# Patient Record
Sex: Male | Born: 1985 | Marital: Married | Smoking: Never smoker
Health system: Southern US, Community
[De-identification: ages and names within clinical notes are randomized; demographics above are authoritative.]

## PROBLEM LIST (undated history)

## (undated) DIAGNOSIS — T7840XA Allergy, unspecified, initial encounter: Secondary | ICD-10-CM

## (undated) DIAGNOSIS — S42009A Fracture of unspecified part of unspecified clavicle, initial encounter for closed fracture: Secondary | ICD-10-CM

## (undated) HISTORY — DX: Fracture of unspecified part of unspecified clavicle, initial encounter for closed fracture: S42.009A

## (undated) HISTORY — DX: Allergy, unspecified, initial encounter: T78.40XA

## (undated) HISTORY — PX: NO PAST SURGERIES: SHX2092

---

## 2006-09-04 ENCOUNTER — Emergency Department: Payer: Self-pay | Admitting: Emergency Medicine

## 2008-09-15 ENCOUNTER — Emergency Department: Payer: Self-pay | Admitting: Emergency Medicine

## 2008-09-24 ENCOUNTER — Emergency Department: Payer: Self-pay | Admitting: Emergency Medicine

## 2009-04-03 ENCOUNTER — Emergency Department: Payer: Self-pay | Admitting: Internal Medicine

## 2010-07-19 ENCOUNTER — Emergency Department: Payer: Self-pay | Admitting: Unknown Physician Specialty

## 2010-07-22 ENCOUNTER — Emergency Department: Payer: Self-pay | Admitting: Internal Medicine

## 2012-04-16 ENCOUNTER — Telehealth: Payer: Self-pay | Admitting: *Deleted

## 2012-04-16 ENCOUNTER — Ambulatory Visit: Payer: Self-pay | Admitting: Internal Medicine

## 2012-04-16 NOTE — Telephone Encounter (Signed)
Noted  

## 2012-04-16 NOTE — Telephone Encounter (Signed)
Patient called back stating he would not be able to keep the r/s appt made for him on 3/12, he need something later in the day. The only thing I saw was on 3/17 at 3:30, which was not 30 minute slot but I put him there. Is that ok?

## 2012-04-16 NOTE — Telephone Encounter (Signed)
That is fine 

## 2012-05-07 ENCOUNTER — Ambulatory Visit: Payer: Self-pay | Admitting: Internal Medicine

## 2012-05-12 ENCOUNTER — Ambulatory Visit (INDEPENDENT_AMBULATORY_CARE_PROVIDER_SITE_OTHER): Payer: PRIVATE HEALTH INSURANCE | Admitting: Internal Medicine

## 2012-05-12 ENCOUNTER — Encounter: Payer: Self-pay | Admitting: Internal Medicine

## 2012-05-12 VITALS — BP 118/74 | HR 70 | Temp 98.2°F | Ht 69.0 in | Wt 190.0 lb

## 2012-05-12 DIAGNOSIS — Z0000 Encounter for general adult medical examination without abnormal findings: Secondary | ICD-10-CM | POA: Insufficient documentation

## 2012-05-12 NOTE — Assessment & Plan Note (Signed)
General medical exam normal today. Health maintenance UTD except for labs. Will check CBC, CMP, lipids, urine microalbumin with labs today. Encouraged pt to limit sodium intake. Encouraged exercise 3x per week. Encouraged diet low in saturated fat and high in fiber. Follow up 1 year and prn.

## 2012-05-12 NOTE — Progress Notes (Signed)
  Subjective:    Patient ID: Greg Bradley, male    DOB: 10-05-1985, 27 y.o.   MRN: [id number]  HPI 26YO male presents to establish care. Notes that BP has been elevated 130-140s SBP on some occasions, especially after eating high-salt foods such as pizza. Denies any chest pain, palpitations, headache. Exercises several times per week at local gym. No new concerns today.  No outpatient encounter prescriptions on file as of 05/12/2012.   No facility-administered encounter medications on file as of 05/12/2012.   BP 118/74  Pulse 70  Temp(Src) 98.2 F (36.8 C) (Oral)  Ht 5\' 9"  (1.753 m)  Wt 190 lb (86.183 kg)  BMI 28.05 kg/m2  SpO2 96%  Review of Systems  Constitutional: Negative for fever, chills, activity change, appetite change, fatigue and unexpected weight change.  Eyes: Negative for visual disturbance.  Respiratory: Negative for cough and shortness of breath.   Cardiovascular: Negative for chest pain, palpitations and leg swelling.  Gastrointestinal: Negative for abdominal pain and abdominal distention.  Genitourinary: Negative for dysuria, urgency and difficulty urinating.  Musculoskeletal: Negative for arthralgias and gait problem.  Skin: Negative for color change and rash.  Hematological: Negative for adenopathy.  Psychiatric/Behavioral: Negative for sleep disturbance and dysphoric mood. The patient is not nervous/anxious.        Objective:   Physical Exam  Constitutional: He is oriented to person, place, and time. He appears well-developed and well-nourished. No distress.  HENT:  Head: Normocephalic and atraumatic.  Right Ear: External ear normal.  Left Ear: External ear normal.  Nose: Nose normal.  Mouth/Throat: Oropharynx is clear and moist. No oropharyngeal exudate.  Eyes: Conjunctivae and EOM are normal. Pupils are equal, round, and reactive to light. Right eye exhibits no discharge. Left eye exhibits no discharge. No scleral icterus.  Neck: Normal range of motion.  Neck supple. No tracheal deviation present. No thyromegaly present.  Cardiovascular: Normal rate, regular rhythm and normal heart sounds.  Exam reveals no gallop and no friction rub.   No murmur heard. Pulmonary/Chest: Effort normal and breath sounds normal. No respiratory distress. He has no wheezes. He has no rales. He exhibits no tenderness.  Abdominal: Soft. Bowel sounds are normal. He exhibits no distension. There is no tenderness. There is no rebound and no guarding.  Musculoskeletal: Normal range of motion. He exhibits no edema.  Lymphadenopathy:    He has no cervical adenopathy.  Neurological: He is alert and oriented to person, place, and time. No cranial nerve deficit. Coordination normal.  Skin: Skin is warm and dry. No rash noted. He is not diaphoretic. No erythema. No pallor.  Psychiatric: He has a normal mood and affect. His behavior is normal. Judgment and thought content normal.          Assessment & Plan:

## 2012-05-13 LAB — CBC WITH DIFFERENTIAL/PLATELET
Basophils Relative: 0.8 % (ref 0.0–3.0)
Eosinophils Relative: 5.9 % — ABNORMAL HIGH (ref 0.0–5.0)
Lymphocytes Relative: 43.8 % (ref 12.0–46.0)
Monocytes Relative: 7.6 % (ref 3.0–12.0)
Neutrophils Relative %: 41.9 % — ABNORMAL LOW (ref 43.0–77.0)
RBC: 4.97 Mil/uL (ref 4.22–5.81)
WBC: 5.6 10*3/uL (ref 4.5–10.5)

## 2012-05-13 LAB — LIPID PANEL
HDL: 63.2 mg/dL (ref >39.00)
LDL Cholesterol: 77 mg/dL (ref 0–99)
Total CHOL/HDL Ratio: 2
Triglycerides: 45 mg/dL (ref 0.0–149.0)

## 2012-05-13 LAB — COMPREHENSIVE METABOLIC PANEL
Alkaline Phosphatase: 60 U/L (ref 39–117)
BUN: 18 mg/dL (ref 6–23)
Glucose, Bld: 92 mg/dL (ref 70–99)
Sodium: 136 mEq/L (ref 135–145)
Total Bilirubin: 0.6 mg/dL (ref 0.3–1.2)
Total Protein: 7.8 g/dL (ref 6.0–8.3)

## 2012-05-13 LAB — MICROALBUMIN / CREATININE URINE RATIO
Creatinine,U: 125.4 mg/dL
Microalb Creat Ratio: 0.1 mg/g (ref 0.0–30.0)
Microalb, Ur: 0.1 mg/dL (ref 0.0–1.9)

## 2012-05-14 ENCOUNTER — Encounter: Payer: Self-pay | Admitting: *Deleted

## 2013-10-09 ENCOUNTER — Ambulatory Visit (INDEPENDENT_AMBULATORY_CARE_PROVIDER_SITE_OTHER): Payer: PRIVATE HEALTH INSURANCE | Admitting: Internal Medicine

## 2013-10-09 ENCOUNTER — Encounter: Payer: Self-pay | Admitting: Internal Medicine

## 2013-10-09 VITALS — BP 110/70 | HR 65 | Temp 97.8°F | Ht 71.0 in | Wt 189.2 lb

## 2013-10-09 DIAGNOSIS — Z0000 Encounter for general adult medical examination without abnormal findings: Secondary | ICD-10-CM

## 2013-10-09 LAB — COMPREHENSIVE METABOLIC PANEL
ALBUMIN: 3.9 g/dL (ref 3.5–5.2)
ALT: 17 U/L (ref 0–53)
AST: 23 U/L (ref 0–37)
Alkaline Phosphatase: 52 U/L (ref 39–117)
BILIRUBIN TOTAL: 0.7 mg/dL (ref 0.2–1.2)
BUN: 11 mg/dL (ref 6–23)
CO2: 31 meq/L (ref 19–32)
Calcium: 9.9 mg/dL (ref 8.4–10.5)
Chloride: 101 mEq/L (ref 96–112)
Creatinine, Ser: 1.1 mg/dL (ref 0.4–1.5)
GFR: 84.99 mL/min (ref >60.00)
GLUCOSE: 94 mg/dL (ref 70–99)
POTASSIUM: 4.3 meq/L (ref 3.5–5.1)
SODIUM: 135 meq/L (ref 135–145)
TOTAL PROTEIN: 6.8 g/dL (ref 6.0–8.3)

## 2013-10-09 LAB — TSH: TSH: 0.64 u[IU]/mL (ref 0.35–4.50)

## 2013-10-09 LAB — CBC WITH DIFFERENTIAL/PLATELET
BASOS PCT: 0.9 % (ref 0.0–3.0)
Basophils Absolute: 0 10*3/uL (ref 0.0–0.1)
EOS PCT: 7 % — AB (ref 0.0–5.0)
Eosinophils Absolute: 0.3 10*3/uL (ref 0.0–0.7)
HEMATOCRIT: 44.6 % (ref 39.0–52.0)
HEMOGLOBIN: 14.8 g/dL (ref 13.0–17.0)
LYMPHS ABS: 1.8 10*3/uL (ref 0.7–4.0)
Lymphocytes Relative: 42.6 % (ref 12.0–46.0)
MCHC: 33.1 g/dL (ref 30.0–36.0)
MCV: 88.6 fl (ref 78.0–100.0)
MONOS PCT: 7 % (ref 3.0–12.0)
Monocytes Absolute: 0.3 10*3/uL (ref 0.1–1.0)
Neutro Abs: 1.8 10*3/uL (ref 1.4–7.7)
Neutrophils Relative %: 42.5 % — ABNORMAL LOW (ref 43.0–77.0)
PLATELETS: 150 10*3/uL (ref 150.0–400.0)
RBC: 5.04 Mil/uL (ref 4.22–5.81)
RDW: 13.2 % (ref 11.5–15.5)
WBC: 4.3 10*3/uL (ref 4.0–10.5)

## 2013-10-09 LAB — LIPID PANEL
CHOLESTEROL: 147 mg/dL (ref 0–200)
HDL: 66.3 mg/dL (ref >39.00)
LDL Cholesterol: 72 mg/dL (ref 0–99)
NonHDL: 80.7
Total CHOL/HDL Ratio: 2
Triglycerides: 43 mg/dL (ref 0.0–149.0)
VLDL: 8.6 mg/dL (ref 0.0–40.0)

## 2013-10-09 NOTE — Patient Instructions (Signed)

## 2013-10-09 NOTE — Progress Notes (Signed)
   Subjective:    Patient ID: Greg Bradley, male    DOB: 08/01/1985, 28 y.o.   MRN: [id number]  HPI 28YO male presents for annual exam.  Playing basketball for exercise. Follows regular diet.  Good energy levels. Good appetite. No changes in bowel habits.  No concerns today.  Review of Systems  Constitutional: Negative for fever, chills, activity change, appetite change, fatigue and unexpected weight change.  Eyes: Negative for visual disturbance.  Respiratory: Negative for cough and shortness of breath.   Cardiovascular: Negative for chest pain, palpitations and leg swelling.  Gastrointestinal: Negative for nausea, vomiting, abdominal pain, diarrhea, constipation, blood in stool and abdominal distention.  Genitourinary: Negative for dysuria, urgency and difficulty urinating.  Musculoskeletal: Negative for arthralgias, gait problem and myalgias.  Skin: Negative for color change and rash.  Neurological: Negative for weakness.  Hematological: Negative for adenopathy.  Psychiatric/Behavioral: Negative for sleep disturbance and dysphoric mood. The patient is not nervous/anxious.        Objective:    BP 110/70  Pulse 65  Temp(Src) 97.8 F (36.6 C) (Oral)  Ht 5\' 11"  (1.803 m)  Wt 189 lb 4 oz (85.843 kg)  BMI 26.41 kg/m2  SpO2 95% Physical Exam  Constitutional: He is oriented to person, place, and time. He appears well-developed and well-nourished. No distress.  HENT:  Head: Normocephalic and atraumatic.  Right Ear: External ear normal.  Left Ear: External ear normal.  Nose: Nose normal.  Mouth/Throat: Oropharynx is clear and moist. No oropharyngeal exudate.  Eyes: Conjunctivae and EOM are normal. Pupils are equal, round, and reactive to light. Right eye exhibits no discharge. Left eye exhibits no discharge. No scleral icterus.  Neck: Normal range of motion. Neck supple. No tracheal deviation present. No thyromegaly present.  Cardiovascular: Normal rate, regular rhythm and normal  heart sounds.  Exam reveals no gallop and no friction rub.   No murmur heard. Pulmonary/Chest: Effort normal and breath sounds normal. No respiratory distress. He has no wheezes. He has no rales. He exhibits no tenderness.  Abdominal: Soft. Bowel sounds are normal. He exhibits no distension and no mass. There is no tenderness. There is no rebound and no guarding.  Musculoskeletal: Normal range of motion. He exhibits no edema.  Lymphadenopathy:    He has no cervical adenopathy.  Neurological: He is alert and oriented to person, place, and time. No cranial nerve deficit. Coordination normal.  Skin: Skin is warm and dry. No rash noted. He is not diaphoretic. No erythema. No pallor.  Psychiatric: He has a normal mood and affect. His behavior is normal. Judgment and thought content normal.          Assessment & Plan:   Problem List Items Addressed This Visit     Unprioritized   Routine general medical examination at a health care facility - Primary     General medical exam normal today. Encouraged healthy, Mediterranean style diet and regular exercise with goal of 40min 3x per week. Immunizations are UTD except for flu vaccine which he will get when available. Will check labs including CBC, CMP, lipids, TSH.     Relevant Orders      TSH      CBC with Differential      Comprehensive metabolic panel      Lipid panel       Return in about 1 year (around 10/10/2014) for Physical.

## 2013-10-09 NOTE — Assessment & Plan Note (Signed)
General medical exam normal today. Encouraged healthy, Mediterranean style diet and regular exercise with goal of 3x per week. Immunizations are UTD except for flu vaccine which he will get when available. Will check labs including CBC, CMP, lipids, TSH.

## 2013-10-10 ENCOUNTER — Encounter: Payer: Self-pay | Admitting: Internal Medicine

## 2013-11-21 ENCOUNTER — Ambulatory Visit: Payer: PRIVATE HEALTH INSURANCE

## 2014-09-10 ENCOUNTER — Encounter: Payer: Self-pay | Admitting: Internal Medicine

## 2014-09-10 ENCOUNTER — Ambulatory Visit (INDEPENDENT_AMBULATORY_CARE_PROVIDER_SITE_OTHER): Payer: Managed Care, Other (non HMO) | Admitting: Internal Medicine

## 2014-09-10 VITALS — BP 110/68 | HR 70 | Temp 98.5°F | Resp 14 | Ht 70.0 in | Wt 192.2 lb

## 2014-09-10 DIAGNOSIS — M25561 Pain in right knee: Secondary | ICD-10-CM | POA: Diagnosis not present

## 2014-09-10 DIAGNOSIS — R229 Localized swelling, mass and lump, unspecified: Secondary | ICD-10-CM

## 2014-09-10 NOTE — Assessment & Plan Note (Signed)
Small nodule of right elbow. Given history of recent infection at this site, most likely sebaceous cyst. Other consideration would be rheumatoid nodule. Will monitor for next few weeks. If no resolution, will set up dermatology evaluation for removal.

## 2014-09-10 NOTE — Progress Notes (Signed)
Pre-visit discussion using our clinic review tool. No additional management support is needed unless otherwise documented below in the visit note.  

## 2014-09-10 NOTE — Patient Instructions (Signed)
Email if any concerns or if you would like to set up a dermatology evaluation.  Consider using Gentamicin if any recurrence.

## 2014-09-10 NOTE — Assessment & Plan Note (Signed)
Right inferior knee pain. Suspect infrapatellar bursitis. Now improving. Exam normal. Discussed icing and using support brace. Plan for follow up prn.

## 2014-09-10 NOTE — Progress Notes (Signed)
Subjective:    Patient ID: Greg Bradley, male    DOB: 05/05/1985, 29 y.o.   MRN: [id number]  HPI  28YO male presents for acute visit.  Elbow pain - First noticed about 1.5 months ago. Initially swelled to size of a quarter then improved. Continues to have fluid filled sack underneath. No fever, chills.No history of MRSA.  Right knee pain - Noticed pain below his knee cap after playing basketball a few weeks ago. No swelling. No weakness. No instability. Improved with rest. No current pain.  Past medical, surgical, family and social history per today's encounter.  Review of Systems  Constitutional: Negative for fever, chills, activity change, appetite change, fatigue and unexpected weight change.  Eyes: Negative for visual disturbance.  Respiratory: Negative for cough and shortness of breath.   Cardiovascular: Negative for chest pain, palpitations and leg swelling.  Gastrointestinal: Negative for abdominal pain and abdominal distention.  Genitourinary: Negative for dysuria, urgency and difficulty urinating.  Musculoskeletal: Positive for arthralgias. Negative for gait problem.  Skin: Positive for color change and wound. Negative for rash.  Hematological: Negative for adenopathy.  Psychiatric/Behavioral: Negative for sleep disturbance and dysphoric mood. The patient is not nervous/anxious.        Objective:    BP 110/68 mmHg  Pulse 70  Temp(Src) 98.5 F (36.9 C) (Oral)  Resp 14  Ht 5\' 10"  (1.778 m)  Wt 192 lb 4 oz (87.204 kg)  BMI 27.58 kg/m2  SpO2 96% Physical Exam  Constitutional: He is oriented to person, place, and time. He appears well-developed and well-nourished. No distress.  HENT:  Head: Normocephalic and atraumatic.  Right Ear: External ear normal.  Left Ear: External ear normal.  Nose: Nose normal.  Mouth/Throat: Oropharynx is clear and moist.  Eyes: Conjunctivae and EOM are normal. Pupils are equal, round, and reactive to light. Right eye exhibits no  discharge. Left eye exhibits no discharge. No scleral icterus.  Neck: Normal range of motion. Neck supple. No tracheal deviation present. No thyromegaly present.  Cardiovascular: Normal rate, regular rhythm and normal heart sounds.  Exam reveals no gallop and no friction rub.   No murmur heard. Pulmonary/Chest: Effort normal and breath sounds normal. No accessory muscle usage. No tachypnea. No respiratory distress. He has no decreased breath sounds. He has no wheezes. He has no rhonchi. He has no rales. He exhibits no tenderness.  Musculoskeletal: Normal range of motion. He exhibits no edema.       Right knee: He exhibits normal range of motion, no swelling and no deformity. No tenderness found.       Arms:      Legs: Lymphadenopathy:    He has no cervical adenopathy.  Neurological: He is alert and oriented to person, place, and time. No cranial nerve deficit. Coordination normal.  Skin: Skin is warm and dry. No rash noted. He is not diaphoretic. No erythema. No pallor.  Psychiatric: He has a normal mood and affect. His behavior is normal. Judgment and thought content normal.          Assessment & Plan:   Problem List Items Addressed This Visit      Unprioritized   Right knee pain    Right inferior knee pain. Suspect infrapatellar bursitis. Now improving. Exam normal. Discussed icing and using support brace. Plan for follow up prn.      Subcutaneous nodule - Primary    Small nodule of right elbow. Given history of recent infection at this site, most likely sebaceous  cyst. Other consideration would be rheumatoid nodule. Will monitor for next few weeks. If no resolution, will set up dermatology evaluation for removal.          Return if symptoms worsen or fail to improve.

## 2016-04-04 ENCOUNTER — Encounter: Payer: Self-pay | Admitting: Family Medicine

## 2016-04-04 ENCOUNTER — Ambulatory Visit (INDEPENDENT_AMBULATORY_CARE_PROVIDER_SITE_OTHER): Payer: 59 | Admitting: Family Medicine

## 2016-04-04 VITALS — BP 129/76 | HR 67 | Temp 98.2°F | Ht 71.0 in | Wt 202.8 lb

## 2016-04-04 DIAGNOSIS — Z0000 Encounter for general adult medical examination without abnormal findings: Secondary | ICD-10-CM | POA: Diagnosis not present

## 2016-04-04 LAB — COMPREHENSIVE METABOLIC PANEL
ALBUMIN: 4.3 g/dL (ref 3.5–5.2)
ALK PHOS: 61 U/L (ref 39–117)
ALT: 21 U/L (ref 0–53)
AST: 20 U/L (ref 0–37)
BUN: 14 mg/dL (ref 6–23)
CO2: 30 mEq/L (ref 19–32)
CREATININE: 1.16 mg/dL (ref 0.40–1.50)
Calcium: 9.4 mg/dL (ref 8.4–10.5)
Chloride: 104 mEq/L (ref 96–112)
GFR: 77.79 mL/min (ref >60.00)
GLUCOSE: 126 mg/dL — AB (ref 70–99)
Potassium: 3.9 mEq/L (ref 3.5–5.1)
SODIUM: 140 meq/L (ref 135–145)
TOTAL PROTEIN: 7.4 g/dL (ref 6.0–8.3)
Total Bilirubin: 0.5 mg/dL (ref 0.2–1.2)

## 2016-04-04 LAB — CBC
HCT: 45.3 % (ref 39.0–52.0)
Hemoglobin: 15.3 g/dL (ref 13.0–17.0)
MCHC: 33.8 g/dL (ref 30.0–36.0)
MCV: 87.4 fl (ref 78.0–100.0)
PLATELETS: 197 10*3/uL (ref 150.0–400.0)
RBC: 5.19 Mil/uL (ref 4.22–5.81)
RDW: 13.4 % (ref 11.5–15.5)
WBC: 4.8 10*3/uL (ref 4.0–10.5)

## 2016-04-04 LAB — LIPID PANEL
CHOL/HDL RATIO: 2
Cholesterol: 133 mg/dL (ref 0–200)
HDL: 60.7 mg/dL (ref >39.00)
LDL CALC: 61 mg/dL (ref 0–99)
NONHDL: 72.55
Triglycerides: 57 mg/dL (ref 0.0–149.0)
VLDL: 11.4 mg/dL (ref 0.0–40.0)

## 2016-04-04 LAB — HEMOGLOBIN A1C: HEMOGLOBIN A1C: 5.8 % (ref 4.6–6.5)

## 2016-04-04 NOTE — Patient Instructions (Signed)
Follow up annually.  Good to see you.  Take care  Dr. Adriana Simasook  Health Maintenance, Male A healthy lifestyle and preventative care can promote health and wellness.  Maintain regular health, dental, and eye exams.  Eat a healthy diet. Foods like vegetables, fruits, whole grains, low-fat dairy products, and lean protein foods contain the nutrients you need and are low in calories. Decrease your intake of foods high in solid fats, added sugars, and salt. Get information about a proper diet from your health care provider, if necessary.  Regular physical exercise is one of the most important things you can do for your health. Most adults should get at least 150 minutes of moderate-intensity exercise (any activity that increases your heart rate and causes you to sweat) each week. In addition, most adults need muscle-strengthening exercises on 2 or more days a week.   Maintain a healthy weight. The body mass index (BMI) is a screening tool to identify possible weight problems. It provides an estimate of body fat based on height and weight. Your health care provider can find your BMI and can help you achieve or maintain a healthy weight. For males 20 years and older:  A BMI below 18.5 is considered underweight.  A BMI of 18.5 to 24.9 is normal.  A BMI of 25 to 29.9 is considered overweight.  A BMI of 30 and above is considered obese.  Maintain normal blood lipids and cholesterol by exercising and minimizing your intake of saturated fat. Eat a balanced diet with plenty of fruits and vegetables. Blood tests for lipids and cholesterol should begin at age 32 and be repeated every 5 years. If your lipid or cholesterol levels are high, you are over age 32, or you are at high risk for heart disease, you may need your cholesterol levels checked more frequently.Ongoing high lipid and cholesterol levels should be treated with medicines if diet and exercise are not working.  If you smoke, find out from your  health care provider how to quit. If you do not use tobacco, do not start.  Lung cancer screening is recommended for adults aged 55-80 years who are at high risk for developing lung cancer because of a history of smoking. A yearly low-dose CT scan of the lungs is recommended for people who have at least a 30-pack-year history of smoking and are current smokers or have quit within the past 15 years. A pack year of smoking is smoking an average of 1 pack of cigarettes a day for 1 year (for example, a 30-pack-year history of smoking could mean smoking 1 pack a day for 30 years or 2 packs a day for 15 years). Yearly screening should continue until the smoker has stopped smoking for at least 15 years. Yearly screening should be stopped for people who develop a health problem that would prevent them from having lung cancer treatment.  If you choose to drink alcohol, do not have more than 2 drinks per day. One drink is considered to be 12 oz (360 mL) of beer, 5 oz (150 mL) of wine, or 1.5 oz (45 mL) of liquor.  Avoid the use of street drugs. Do not share needles with anyone. Ask for help if you need support or instructions about stopping the use of drugs.  High blood pressure causes heart disease and increases the risk of stroke. High blood pressure is more likely to develop in:  People who have blood pressure in the end of the normal range (100-139/85-89 mm  Hg).  People who are overweight or obese.  People who are African American.  If you are 54-71 years of age, have your blood pressure checked every 3-5 years. If you are 29 years of age or older, have your blood pressure checked every year. You should have your blood pressure measured twice-once when you are at a hospital or clinic, and once when you are not at a hospital or clinic. Record the average of the two measurements. To check your blood pressure when you are not at a hospital or clinic, you can use:  An automated blood pressure machine at a  pharmacy.  A home blood pressure monitor.  If you are 96-36 years old, ask your health care provider if you should take aspirin to prevent heart disease.  Diabetes screening involves taking a blood sample to check your fasting blood sugar level. This should be done once every 3 years after age 1 if you are at a normal weight and without risk factors for diabetes. Testing should be considered at a younger age or be carried out more frequently if you are overweight and have at least 1 risk factor for diabetes.  Colorectal cancer can be detected and often prevented. Most routine colorectal cancer screening begins at the age of 23 and continues through age 83. However, your health care provider may recommend screening at an earlier age if you have risk factors for colon cancer. On a yearly basis, your health care provider may provide home test kits to check for hidden blood in the stool. A small camera at the end of a tube may be used to directly examine the colon (sigmoidoscopy or colonoscopy) to detect the earliest forms of colorectal cancer. Talk to your health care provider about this at age 77 when routine screening begins. A direct exam of the colon should be repeated every 5-10 years through age 66, unless early forms of precancerous polyps or small growths are found.  People who are at an increased risk for hepatitis B should be screened for this virus. You are considered at high risk for hepatitis B if:  You were born in a country where hepatitis B occurs often. Talk with your health care provider about which countries are considered high risk.  Your parents were born in a high-risk country and you have not received a shot to protect against hepatitis B (hepatitis B vaccine).  You have HIV or AIDS.  You use needles to inject street drugs.  You live with, or have sex with, someone who has hepatitis B.  You are a man who has sex with other men (MSM).  You get hemodialysis  treatment.  You take certain medicines for conditions like cancer, organ transplantation, and autoimmune conditions.  Hepatitis C blood testing is recommended for all people born from [birth year] through 1966 and any individual with known risk factors for hepatitis C.  Healthy men should no longer receive prostate-specific antigen (PSA) blood tests as part of routine cancer screening. Talk to your health care provider about prostate cancer screening.  Testicular cancer screening is not recommended for adolescents or adult males who have no symptoms. Screening includes self-exam, a health care provider exam, and other screening tests. Consult with your health care provider about any symptoms you have or any concerns you have about testicular cancer.  Practice safe sex. Use condoms and avoid high-risk sexual practices to reduce the spread of sexually transmitted infections (STIs).  You should be screened for STIs, including gonorrhea and  chlamydia if:  You are sexually active and are younger than 24 years.  You are older than 24 years, and your health care provider tells you that you are at risk for this type of infection.  Your sexual activity has changed since you were last screened, and you are at an increased risk for chlamydia or gonorrhea. Ask your health care provider if you are at risk.  If you are at risk of being infected with HIV, it is recommended that you take a prescription medicine daily to prevent HIV infection. This is called pre-exposure prophylaxis (PrEP). You are considered at risk if:  You are a man who has sex with other men (MSM).  You are a heterosexual man who is sexually active with multiple partners.  You take drugs by injection.  You are sexually active with a partner who has HIV.  Talk with your health care provider about whether you are at high risk of being infected with HIV. If you choose to begin PrEP, you should first be tested for HIV. You should then be tested  every 3 months for as long as you are taking PrEP.  Use sunscreen. Apply sunscreen liberally and repeatedly throughout the day. You should seek shade when your shadow is shorter than you. Protect yourself by wearing long sleeves, pants, a wide-brimmed hat, and sunglasses year round whenever you are outdoors.  Tell your health care provider of new moles or changes in moles, especially if there is a change in shape or color. Also, tell your health care provider if a mole is larger than the size of a pencil eraser.  A one-time screening for abdominal aortic aneurysm (AAA) and surgical repair of large AAAs by ultrasound is recommended for men aged 53-75 years who are current or former smokers.  Stay current with your vaccines (immunizations). This information is not intended to replace advice given to you by your health care provider. Make sure you discuss any questions you have with your health care provider. Document Released: 08/11/2007 Document Revised: 03/05/2014 Document Reviewed: 11/16/2014 Elsevier Interactive Patient Education  2017 [employer].

## 2016-04-04 NOTE — Progress Notes (Signed)
   Subjective:  Patient ID: Greg Bradley, male    DOB: 11/11/1985  Age: 31 y.o. MRN: [id number]  CC: Annual physical  HPI Greg Bradley is a 31 y.o. male presents to the clinic today for an annual physical.  Preventative Healthcare  Immunizations  Tetanus - Up to date.   Flu - Declines.  Labs: Screening labs today.  Alcohol use: No.  Smoking/tobacco use: No  PMH, Surgical Hx, Family Hx, Social History reviewed and updated as below.  Past Medical History:  Diagnosis Date  . Clavicular fracture    left, football injury   Past Surgical History:  Procedure Laterality Date  . NO PAST SURGERIES       Family History  Problem Relation Age of Onset  . Hypertension Paternal Grandmother   . Hypertension Father   . Hypertension Brother   . Cancer Maternal Uncle 65    colon  . Heart disease Paternal Grandfather   . Thyroid disease Mother    Social History  Substance Use Topics  . Smoking status: Never Smoker  . Smokeless tobacco: Never Used  . Alcohol use No   Review of Systems  HENT:       Recent cold.  Respiratory: Positive for cough.   All other systems reviewed and are negative.   Objective:   Today's Vitals: BP 129/76   Pulse 67   Temp 98.2 F (36.8 C) (Oral)   Ht 5\' 11"  (1.803 m)   Wt 202 lb 12.8 oz (92 kg)   SpO2 96%   BMI 28.28 kg/m   Physical Exam  Constitutional: He is oriented to person, place, and time. He appears well-developed and well-nourished. No distress.  HENT:  Head: Normocephalic and atraumatic.  Nose: Nose normal.  Mouth/Throat: Oropharynx is clear and moist. No oropharyngeal exudate.  Normal TM's bilaterally.   Eyes: Conjunctivae are normal. No scleral icterus.  Neck: Neck supple.  Cardiovascular: Normal rate and regular rhythm.   No murmur heard. Pulmonary/Chest: Effort normal and breath sounds normal. He has no wheezes. He has no rales.  Abdominal: Soft. He exhibits no distension. There is no tenderness. There is no rebound and  no guarding.  Musculoskeletal: Normal range of motion. He exhibits no edema.  Lymphadenopathy:    He has no cervical adenopathy.  Neurological: He is alert and oriented to person, place, and time.  Skin: Skin is warm and dry. No rash noted.  Psychiatric: He has a normal mood and affect.  Vitals reviewed.  Assessment & Plan:   Problem List Items Addressed This Visit    Annual physical exam - Primary    Doing well. Declines flu. Tdap up to date. Nonsmoker. No alcohol. Screening labs today.      Relevant Orders   CBC   Hemoglobin A1c   Comprehensive metabolic panel   Lipid panel     Follow-up: Annually  Everlene OtherJayce Alquan Morrish DO [facility] [place]

## 2016-04-04 NOTE — Progress Notes (Signed)
Pre visit review using our clinic review tool, if applicable. No additional management support is needed unless otherwise documented below in the visit note. 

## 2016-04-04 NOTE — Assessment & Plan Note (Signed)
Doing well. Declines flu. Tdap up to date. Nonsmoker. No alcohol. Screening labs today.

## 2017-02-21 ENCOUNTER — Ambulatory Visit: Payer: Self-pay | Admitting: *Deleted

## 2017-02-21 NOTE — Telephone Encounter (Signed)
Patient has had stomach pain for about a week now. He is presently working. Wife is calling to make appointment for husband- she is answering questions about husband. Stomach pain is located on LUQ. Patient has not tried any OTC treatment for relief. Patient had pain after eating- last night- lasting 30-60 minutes. Patient has had some constipation and diarrhea episodes- did have ATB for strep throat 12/14.  Reason for Disposition . [1] Abdominal pain is intermittent AND [2] shoots into chest, with sour taste in mouth  Answer Assessment - Initial Assessment Questions 1. LOCATION: "Where does it hurt?"      LUQ 2. RADIATION: "Does the pain shoot anywhere else?" (e.g., chest, back)     unknown 3. ONSET: "When did the pain begin?" (e.g., minutes, hours or days ago)      1 week 4. SUDDEN: "Gradual or sudden onset?"     Started as sharp pain- 1 week ago. Now more tenderness 5. PATTERN "Does the pain come and go, or is it constant?"    - If constant: "Is it getting better, staying the same, or worsening?"      (Note: Constant means the pain never goes away completely; most serious pain is constant and it progresses)     - If intermittent: "How long does it last?" "Do you have pain now?"     (Note: Intermittent means the pain goes away completely between bouts)     Comes and goes 6. SEVERITY: "How bad is the pain?"  (e.g., Scale 1-10; mild, moderate, or severe)    - MILD (1-3): doesn't interfere with normal activities, abdomen soft and not tender to touch     - MODERATE (4-7): interferes with normal activities or awakens from sleep, tender to touch     - SEVERE (8-10): excruciating pain, doubled over, unable to do any normal activities       Not asked 7. RECURRENT SYMPTOM: "Have you ever had this type of abdominal pain before?" If so, ask: "When was the last time?" and "What happened that time?"      no 8. AGGRAVATING FACTORS: "Does anything seem to cause this pain?" (e.g., foods, stress,  alcohol)     After eating last night 9. CARDIAC SYMPTOMS: "Do you have any of the following symptoms: chest pain, difficulty breathing, sweating, nausea?"     no 10. OTHER SYMPTOMS: "Do you have any other symptoms?" (e.g., fever, vomiting, diarrhea)       Patient started ATB 12/14- for strep throat- stomach pain has occurred during treatment, diarrhea- ? From ATB 11. PREGNANCY: "Is there any chance you are pregnant?" "When was your last menstrual period?"       n/a  Protocols used: ABDOMINAL PAIN - UPPER-A-AH

## 2017-02-27 ENCOUNTER — Other Ambulatory Visit: Payer: Self-pay

## 2017-02-27 ENCOUNTER — Encounter: Payer: Self-pay | Admitting: Family Medicine

## 2017-02-27 ENCOUNTER — Ambulatory Visit (INDEPENDENT_AMBULATORY_CARE_PROVIDER_SITE_OTHER): Payer: 59 | Admitting: Family Medicine

## 2017-02-27 VITALS — BP 100/70 | HR 67 | Temp 97.8°F | Ht 71.0 in | Wt 200.0 lb

## 2017-02-27 DIAGNOSIS — R1032 Left lower quadrant pain: Secondary | ICD-10-CM

## 2017-02-27 DIAGNOSIS — R197 Diarrhea, unspecified: Secondary | ICD-10-CM | POA: Diagnosis not present

## 2017-02-27 LAB — CBC WITH DIFFERENTIAL/PLATELET
BASOS ABS: 0 10*3/uL (ref 0.0–0.1)
Basophils Relative: 1.4 % (ref 0.0–3.0)
EOS PCT: 4.8 % (ref 0.0–5.0)
Eosinophils Absolute: 0.2 10*3/uL (ref 0.0–0.7)
HEMATOCRIT: 46 % (ref 39.0–52.0)
Hemoglobin: 15.2 g/dL (ref 13.0–17.0)
LYMPHS PCT: 53 % — AB (ref 12.0–46.0)
Lymphs Abs: 1.9 10*3/uL (ref 0.7–4.0)
MCHC: 33 g/dL (ref 30.0–36.0)
MCV: 88.8 fl (ref 78.0–100.0)
MONOS PCT: 6.7 % (ref 3.0–12.0)
Monocytes Absolute: 0.2 10*3/uL (ref 0.1–1.0)
NEUTROS ABS: 1.2 10*3/uL — AB (ref 1.4–7.7)
Neutrophils Relative %: 34.1 % — ABNORMAL LOW (ref 43.0–77.0)
PLATELETS: 211 10*3/uL (ref 150.0–400.0)
RBC: 5.19 Mil/uL (ref 4.22–5.81)
RDW: 13.5 % (ref 11.5–15.5)
WBC: 3.5 10*3/uL — ABNORMAL LOW (ref 4.0–10.5)

## 2017-02-27 LAB — BASIC METABOLIC PANEL
BUN: 15 mg/dL (ref 6–23)
CALCIUM: 9.5 mg/dL (ref 8.4–10.5)
CHLORIDE: 99 meq/L (ref 96–112)
CO2: 32 meq/L (ref 19–32)
Creatinine, Ser: 1.17 mg/dL (ref 0.40–1.50)
GFR: 76.59 mL/min (ref >60.00)
Glucose, Bld: 94 mg/dL (ref 70–99)
POTASSIUM: 3.8 meq/L (ref 3.5–5.1)
SODIUM: 138 meq/L (ref 135–145)

## 2017-02-27 LAB — LIPASE: Lipase: 19 U/L (ref 11.0–59.0)

## 2017-02-27 LAB — HEPATIC FUNCTION PANEL
ALBUMIN: 4.4 g/dL (ref 3.5–5.2)
ALK PHOS: 58 U/L (ref 39–117)
ALT: 13 U/L (ref 0–53)
AST: 19 U/L (ref 0–37)
Bilirubin, Direct: 0.1 mg/dL (ref 0.0–0.3)
TOTAL PROTEIN: 7.5 g/dL (ref 6.0–8.3)
Total Bilirubin: 0.6 mg/dL (ref 0.2–1.2)

## 2017-02-27 NOTE — Progress Notes (Signed)
Dr. Karleen Hampshire T. Ulises Wolfinger, MD, [facility] [address] [place], [address] Phone: [phone] Fax: [phone]  02/27/2017  Patient: Greg Bradley, MRN: [id number], DOB: 1986/01/21, 32 y.o.  Primary Physician:  Tommie Sams, DO   Chief Complaint  Patient presents with  . LLQ Pain    on & off x 3 weeks  . Diarrhea   Subjective:   Greg Bradley is a 32 y.o. very pleasant male patient who presents with the following:  Recent ABX for strep. PCN Stomach discomfort on the left side. ABX for the last 3 weeks.  Nauseated after eating.  He is here with his wife additionally today, who also provides additional history.  According to the patient, he is feeling somewhat better today compared to yesterday.  He has not been able to eat all that much, and he has been having some nausea, but no significant vomiting.  He is not having any bloody stools or black tarry stool.  Past Medical History, Surgical History, Social History, Family History, Problem List, Medications, and Allergies have been reviewed and updated if relevant.  Patient Active Problem List   Diagnosis Date Noted  . Annual physical exam 05/12/2012    Past Medical History:  Diagnosis Date  . Clavicular fracture    left, football injury    Past Surgical History:  Procedure Laterality Date  . NO PAST SURGERIES      Social History   Socioeconomic History  . Marital status: Married    Spouse name: Not on file  . Number of children: Not on file  . Years of education: Not on file  . Highest education level: Not on file  Social Needs  . Financial resource strain: Not on file  . Food insecurity - worry: Not on file  . Food insecurity - inability: Not on file  . Transportation needs - medical: Not on file  . Transportation needs - non-medical: Not on file  Occupational History  . Not on file  Tobacco Use  . Smoking status: Never Smoker  . Smokeless tobacco: Never  Used  Substance and Sexual Activity  . Alcohol use: No  . Drug use: No  . Sexual activity: Not on file  Other Topics Concern  . Not on file  Social History Narrative   Lives with wife and 2 children in [place]. 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[employer]      Diet - regular      Exercise - 3 days per week, [employer]    Family History  Problem Relation Age of Onset  . Hypertension Paternal Grandmother   . Hypertension Father   . Hypertension Brother   . Cancer Maternal Uncle 65       colon  . Heart disease Paternal Grandfather   . Thyroid disease Mother     No Known Allergies  Medication list reviewed and updated in full in [facility].   GEN: No acute illnesses, no fevers, chills. GI: No n/v/d, eating normally Pulm: No SOB Interactive and getting along well at home.  Otherwise, ROS is as per the HPI.  Objective:   BP 100/70   Pulse 67   Temp 97.8 F (36.6 C) (Oral)   Ht 5\' 11"  (1.803 m)   Wt 200 lb (90.7 kg)   BMI 27.89 kg/m   GEN: WDWN, NAD, Non-toxic,  A & O x 3 HEENT: Atraumatic, Normocephalic. Neck supple. No masses, No LAD. Ears and Nose: No external deformity. CV: RRR, No M/G/R. No JVD. No thrill. No extra heart sounds. PULM: CTA B, no wheezes, crackles, rhonchi. No retractions. No resp. distress. No accessory muscle use. ABD: S, mild LLQ tenderness, ND, + BS, No rebound, No HSM  EXTR: No c/c/e NEURO Normal gait.  PSYCH: Normally interactive. Conversant. Not depressed or anxious appearing.  Calm demeanor.   Laboratory and Imaging Data: Results for orders placed or performed in visit on 02/27/17  Basic metabolic panel  Result Value Ref Range   Sodium 138 135 - 145 mEq/L   Potassium 3.8 3.5 - 5.1 mEq/L   Chloride 99 96 - 112 mEq/L   CO2 32 19 - 32 mEq/L   Glucose, Bld 94 70 - 99 mg/dL   BUN 15 6 - 23 mg/dL   Creatinine, Ser 1.611.17 0.40 - 1.50 mg/dL   Calcium 9.5 8.4 - 09.610.5 mg/dL   GFR 04.5476.59 >09.81>60.00  mL/min  CBC with Differential/Platelet  Result Value Ref Range   WBC 3.5 (L) 4.0 - 10.5 K/uL   RBC 5.19 4.22 - 5.81 Mil/uL   Hemoglobin 15.2 13.0 - 17.0 g/dL   HCT 19.146.0 47.839.0 - 29.552.0 %   MCV 88.8 78.0 - 100.0 fl   MCHC 33.0 30.0 - 36.0 g/dL   RDW 62.113.5 30.811.5 - 65.715.5 %   Platelets 211.0 150.0 - 400.0 K/uL   Neutrophils Relative % 34.1 (L) 43.0 - 77.0 %   Lymphocytes Relative 53.0 (H) 12.0 - 46.0 %   Monocytes Relative 6.7 3.0 - 12.0 %   Eosinophils Relative 4.8 0.0 - 5.0 %   Basophils Relative 1.4 0.0 - 3.0 %   Neutro Abs 1.2 (L) 1.4 - 7.7 K/uL   Lymphs Abs 1.9 0.7 - 4.0 K/uL   Monocytes Absolute 0.2 0.1 - 1.0 K/uL   Eosinophils Absolute 0.2 0.0 - 0.7 K/uL   Basophils Absolute 0.0 0.0 - 0.1 K/uL  Hepatic function panel  Result Value Ref Range   Total Bilirubin 0.6 0.2 - 1.2 mg/dL   Bilirubin, Direct 0.1 0.0 - 0.3 mg/dL   Alkaline Phosphatase 58 39 - 117 U/L   AST 19 0 - 37 U/L   ALT 13 0 - 53 U/L   Total Protein 7.5 6.0 - 8.3 g/dL   Albumin 4.4 3.5 - 5.2 g/dL  Lipase  Result Value Ref Range   Lipase 19.0 11.0 - 59.0 U/L  C. difficile GDH and Toxin A/B  Result Value Ref Range   MICRO NUMBER: [id number]    SPECIMEN QUALITY: ADEQUATE    Source STOOL    STATUS: FINAL    GDH ANTIGEN Detected    TOXIN A AND B Not Detected    COMMENT      Indeterminate. Specimen forwarded for toxigenic C. difficile PCR testing. For additional information, please refer to http://education.[email or web address].com/faq/[email or web address] (This link is being provided for informational/educational purposes only.)  Clostridium difficile Toxin B, Qualitative, Real-Time PCR  Result Value Ref Range   Toxigenic C. Difficile by PCR NOT DETECTED NOT DETECT     Assessment and Plan:   LLQ pain - Plan: Basic metabolic panel, CBC with Differential/Platelet, Hepatic function panel, Lipase, C. difficile GDH and Toxin A/B, C. difficile GDH and Toxin A/B, CANCELED: Clostridium Difficile by PCR  Diarrhea, unspecified type  In the  setting of exam and all lab work, very reassuring.  Likely either viral gastroenteritis or secondary  to antibiotic use recently.  Will combination therein.  Follow-up: No Follow-up on file.  Orders Placed This Encounter  Procedures  . Basic metabolic panel  . CBC with Differential/Platelet  . Hepatic function panel  . Lipase  . C. difficile GDH and Toxin A/B  . Clostridium difficile Toxin B, Qualitative, Real-Time PCR    Signed,  Vessie Olmsted T. Fedra Lanter, MD   Allergies as of 02/27/2017   No Known Allergies     Medication List    as of 02/27/2017 11:59 PM   You have not been prescribed any medications.

## 2017-02-28 LAB — C. DIFFICILE GDH AND TOXIN A/B
GDH ANTIGEN: DETECTED
MICRO NUMBER: 90003913
SPECIMEN QUALITY: ADEQUATE
TOXIN A AND B: NOT DETECTED

## 2017-02-28 LAB — CLOSTRIDIUM DIFFICILE TOXIN B, QUALITATIVE, REAL-TIME PCR: Toxigenic C. Difficile by PCR: NOT DETECTED

## 2017-07-11 ENCOUNTER — Encounter (INDEPENDENT_AMBULATORY_CARE_PROVIDER_SITE_OTHER): Payer: Self-pay

## 2017-07-12 ENCOUNTER — Ambulatory Visit (INDEPENDENT_AMBULATORY_CARE_PROVIDER_SITE_OTHER): Payer: 59 | Admitting: Family Medicine

## 2017-07-12 ENCOUNTER — Encounter: Payer: Self-pay | Admitting: Family Medicine

## 2017-07-12 VITALS — BP 118/80 | HR 70 | Ht 71.0 in | Wt 198.4 lb

## 2017-07-12 DIAGNOSIS — L03116 Cellulitis of left lower limb: Secondary | ICD-10-CM | POA: Diagnosis not present

## 2017-07-12 MED ORDER — DOXYCYCLINE HYCLATE 100 MG PO TABS: 100.0000 mg | ORAL_TABLET | Freq: Two times a day (BID) | ORAL | 0 refills | Status: DC

## 2017-07-12 NOTE — Patient Instructions (Signed)
Cellulitis, Adult Cellulitis is a skin infection. The infected area is usually red and tender. This condition occurs most often in the arms and lower legs. The infection can travel to the muscles, blood, and underlying tissue and become serious. It is very important to get treated for this condition. What are the causes? Cellulitis is caused by bacteria. The bacteria enter through a break in the skin, such as a cut, burn, insect bite, open sore, or crack. What increases the risk? This condition is more likely to occur in people who:  Have a weak defense system (immune system).  Have open wounds on the skin such as cuts, burns, bites, and scrapes. Bacteria can enter the body through these open wounds.  Are older.  Have diabetes.  Have a type of long-lasting (chronic) liver disease (cirrhosis) or kidney disease.  Use IV drugs.  What are the signs or symptoms? Symptoms of this condition include:  Redness, streaking, or spotting on the skin.  Swollen area of the skin.  Tenderness or pain when an area of the skin is touched.  Warm skin.  Fever.  Chills.  Blisters.  How is this diagnosed? This condition is diagnosed based on a medical history and physical exam. You may also have tests, including:  Blood tests.  Lab tests.  Imaging tests.  How is this treated? Treatment for this condition may include:  Medicines, such as antibiotic medicines or antihistamines.  Supportive care, such as rest and application of cold or warm cloths (cold or warm compresses) to the skin.  Hospital care, if the condition is severe.  The infection usually gets better within 1-2 days of treatment. Follow these instructions at home:  Take over-the-counter and prescription medicines only as told by your health care provider.  If you were prescribed an antibiotic medicine, take it as told by your health care provider. Do not stop taking the antibiotic even if you start to feel  better.  Drink enough fluid to keep your urine clear or pale yellow.  Do not touch or rub the infected area.  Raise (elevate) the infected area above the level of your heart while you are sitting or lying down.  Apply warm or cold compresses to the affected area as told by your health care provider.  Keep all follow-up visits as told by your health care provider. This is important. These visits let your health care provider make sure a more serious infection is not developing. Contact a health care provider if:  You have a fever.  Your symptoms do not improve within 1-2 days of starting treatment.  Your bone or joint underneath the infected area becomes painful after the skin has healed.  Your infection returns in the same area or another area.  You notice a swollen bump in the infected area.  You develop new symptoms.  You have a general ill feeling (malaise) with muscle aches and pains. Get help right away if:  Your symptoms get worse.  You feel very sleepy.  You develop vomiting or diarrhea that persists.  You notice red streaks coming from the infected area.  Your red area gets larger or turns dark in color. This information is not intended to replace advice given to you by your health care provider. Make sure you discuss any questions you have with your health care provider. Document Released: 11/22/2004 Document Revised: 06/23/2015 Document Reviewed: 12/22/2014 Elsevier Interactive Patient Education  2018 [employer]  Folliculitis Folliculitis is inflammation of the hair follicles. Folliculitis  most commonly occurs on the scalp, thighs, legs, back, and buttocks. However, it can occur anywhere on the body. What are the causes? This condition may be caused by:  A bacterial infection (common).  A fungal infection.  A viral infection.  Coming into contact with certain chemicals, especially oils and tars.  Shaving or waxing.  Applying greasy ointments or  creams to your skin often.  Long-lasting folliculitis and folliculitis that keeps coming back can be caused by bacteria that live in the nostrils. What increases the risk? This condition is more likely to develop in people with:  A weakened immune system.  Diabetes.  Obesity.  What are the signs or symptoms? Symptoms of this condition include:  Redness.  Soreness.  Swelling.  Itching.  Small white or yellow, pus-filled, itchy spots (pustules) that appear over a reddened area. If there is an infection that goes deep into the follicle, these may develop into a boil (furuncle).  A group of closely packed boils (carbuncle). These tend to form in hairy, sweaty areas of the body.  How is this diagnosed? This condition is diagnosed with a skin exam. To find what is causing the condition, your health care provider may take a sample of one of the pustules or boils for testing. How is this treated? This condition may be treated by:  Applying warm compresses to the affected areas.  Taking an antibiotic medicine or applying an antibiotic medicine to the skin.  Applying or bathing with an antiseptic solution.  Taking an over-the-counter medicine to help with itching.  Having a procedure to drain any pustules or boils. This may be done if a pustule or boil contains a lot of pus or fluid.  Laser hair removal. This may be done to treat long-lasting folliculitis.  Follow these instructions at home:  If directed, apply heat to the affected area as often as told by your health care provider. Use the heat source that your health care provider recommends, such as a moist heat pack or a heating pad. ? Place a towel between your skin and the heat source. ? Leave the heat on for 20-30 minutes. ? Remove the heat if your skin turns bright red. This is especially important if you are unable to feel pain, heat, or cold. You may have a greater risk of getting burned.  If you were prescribed an  antibiotic medicine, use it as told by your health care provider. Do not stop using the antibiotic even if you start to feel better.  Take over-the-counter and prescription medicines only as told by your health care provider.  Do not shave irritated skin.  Keep all follow-up visits as told by your health care provider. This is important. Get help right away if:  You have more redness, swelling, or pain in the affected area.  Red streaks are spreading from the affected area.  You have a fever. This information is not intended to replace advice given to you by your health care provider. Make sure you discuss any questions you have with your health care provider. Document Released: 04/23/2001 Document Revised: 09/02/2015 Document Reviewed: 12/03/2014 Elsevier Interactive Patient Education  2018 [employer]. Doxycycline tablets or capsules What is this medicine? DOXYCYCLINE (dox i SYE kleen) is a tetracycline antibiotic. It kills certain bacteria or stops their growth. It is used to treat many kinds of infections, like dental, skin, respiratory, and urinary tract infections. It also treats acne, Lyme disease, malaria, and certain sexually transmitted infections. This medicine  may be used for other purposes; ask your health care provider or pharmacist if you have questions. COMMON BRAND NAME(S): Acticlate, Adoxa, Adoxa CK, Adoxa Pak, Adoxa TT, Alodox, Avidoxy, Doxal, Mondoxyne NL, Monodox, Morgidox 1x, Morgidox 1x Kit, Morgidox 2x, Morgidox 2x Kit, NutriDox, Ocudox, TARGADOX, Vibra-Tabs, Vibramycin What should I tell my health care provider before I take this medicine? They need to know if you have any of these conditions: -liver disease -long exposure to sunlight like working outdoors -stomach problems like colitis -an unusual or allergic reaction to doxycycline, tetracycline antibiotics, other medicines, foods, dyes, or preservatives -pregnant or trying to get  pregnant -breast-feeding How should I use this medicine? Take this medicine by mouth with a full glass of water. Follow the directions on the prescription label. It is best to take this medicine without food, but if it upsets your stomach take it with food. Take your medicine at regular intervals. Do not take your medicine more often than directed. Take all of your medicine as directed even if you think you are better. Do not skip doses or stop your medicine early. Talk to your pediatrician regarding the use of this medicine in children. While this drug may be prescribed for selected conditions, precautions do apply. Overdosage: If you think you have taken too much of this medicine contact a poison control center or emergency room at once. NOTE: This medicine is only for you. Do not share this medicine with others. What if I miss a dose? If you miss a dose, take it as soon as you can. If it is almost time for your next dose, take only that dose. Do not take double or extra doses. What may interact with this medicine? -antacids -barbiturates -birth control pills -bismuth subsalicylate -carbamazepine -methoxyflurane -other antibiotics -phenytoin -vitamins that contain iron -warfarin This list may not describe all possible interactions. Give your health care provider a list of all the medicines, herbs, non-prescription drugs, or dietary supplements you use. Also tell them if you smoke, drink alcohol, or use illegal drugs. Some items may interact with your medicine. What should I watch for while using this medicine? Tell your doctor or health care professional if your symptoms do not improve. Do not treat diarrhea with over the counter products. Contact your doctor if you have diarrhea that lasts more than 2 days or if it is severe and watery. Do not take this medicine just before going to bed. It may not dissolve properly when you lay down and can cause pain in your throat. Drink plenty of  fluids while taking this medicine to also help reduce irritation in your throat. This medicine can make you more sensitive to the sun. Keep out of the sun. If you cannot avoid being in the sun, wear protective clothing and use sunscreen. Do not use sun lamps or tanning beds/booths. Birth control pills may not work properly while you are taking this medicine. Talk to your doctor about using an extra method of birth control. If you are being treated for a sexually transmitted infection, avoid sexual contact until you have finished your treatment. Your sexual partner may also need treatment. Avoid antacids, aluminum, calcium, magnesium, and iron products for 4 hours before and 2 hours after taking a dose of this medicine. If you are using this medicine to prevent malaria, you should still protect yourself from contact with mosquitos. Stay in screened-in areas, use mosquito nets, keep your body covered, and use an insect repellent. What side effects may I  notice from receiving this medicine? Side effects that you should report to your doctor or health care professional as soon as possible: -allergic reactions like skin rash, itching or hives, swelling of the face, lips, or tongue -difficulty breathing -fever -itching in the rectal or genital area -pain on swallowing -redness, blistering, peeling or loosening of the skin, including inside the mouth -severe stomach pain or cramps -unusual bleeding or bruising -unusually weak or tired -yellowing of the eyes or skin Side effects that usually do not require medical attention (report to your doctor or health care professional if they continue or are bothersome): -diarrhea -loss of appetite -nausea, vomiting This list may not describe all possible side effects. Call your doctor for medical advice about side effects. You may report side effects to FDA at 1-800-FDA-1088. Where should I keep my medicine? Keep out of the reach of children. Store at room  temperature, below 30 degrees C (86 degrees F). Protect from light. Keep container tightly closed. Throw away any unused medicine after the expiration date. Taking this medicine after the expiration date can make you seriously ill. NOTE: This sheet is a summary. It may not cover all possible information. If you have questions about this medicine, talk to your doctor, pharmacist, or health care provider.  2018 Elsevier/Gold Standard (2015-03-16 17:11:22)

## 2017-07-12 NOTE — Progress Notes (Signed)
Subjective:  Patient ID: Greg Bradley, male    DOB: 1985/08/31  Age: 32 y.o. MRN: [id number]  CC: Establish Care   HPI Greg Bradley presents for evaluation of a 4-day history of a tender swelling on his left thigh.  There is been no injury.  He has tried warm compresses.  His wife expresses concern about a blood clot.  Patient has no history of DVT.  There is no family history of DVT.  Patient has no history of lipoma.  He has no history of MRSA or staph cellulitis.  Chart review shows that patient has had an elevated blood sugar in the past but his hemoglobin A1c was normal.  No outpatient medications prior to visit.   No facility-administered medications prior to visit.     ROS Review of Systems  Constitutional: Negative for chills, fatigue, fever and unexpected weight change.  HENT: Negative.   Eyes: Negative.   Respiratory: Negative.   Cardiovascular: Negative.   Gastrointestinal: Negative.   Endocrine: Negative for polyphagia and polyuria.  Genitourinary: Negative for frequency.  Musculoskeletal: Negative for gait problem and myalgias.  Skin: Positive for color change and rash.  Allergic/Immunologic: Negative for immunocompromised state.  Neurological: Negative for weakness and numbness.  Hematological: Does not bruise/bleed easily.  Psychiatric/Behavioral: Negative.     Objective:  BP 118/80   Pulse 70   Ht  (1.803 m)   Wt 198 lb 6 oz (90 kg)   SpO2 96%   BMI 27.67 kg/m   BP Readings from Last 3 Encounters:  07/12/17 118/80  02/27/17 100/70  04/04/16 129/76    Wt Readings from Last 3 Encounters:  07/12/17 198 lb 6 oz (90 kg)  02/27/17 200 lb (90.7 kg)  04/04/16 202 lb 12.8 oz (92 kg)    Physical Exam  Constitutional: He is oriented to person, place, and time. He appears well-developed and well-nourished.  HENT:  Head: Normocephalic and atraumatic.  Right Ear: External ear normal.  Left Ear: External ear normal.  Eyes: Right eye exhibits no  discharge. Left eye exhibits no discharge. No scleral icterus.  Neck: No JVD present. No tracheal deviation present.  Pulmonary/Chest: Effort normal.  Neurological: He is alert and oriented to person, place, and time.  Skin: Skin is warm. He is not diaphoretic. There is erythema.     Psychiatric: He has a normal mood and affect. His behavior is normal.    Lab Results  Component Value Date   WBC 3.5 (L) 02/27/2017   HGB 15.2 02/27/2017   HCT 46.0 02/27/2017   PLT 211.0 02/27/2017   GLUCOSE 94 02/27/2017   CHOL 133 04/04/2016   TRIG 57.0 04/04/2016   HDL 60.70 04/04/2016   LDLCALC 61 04/04/2016   ALT 13 02/27/2017   AST 19 02/27/2017   NA 138 02/27/2017   K 3.8 02/27/2017   CL 99 02/27/2017   CREATININE 1.17 02/27/2017   BUN 15 02/27/2017   CO2 32 02/27/2017   TSH 0.64 10/09/2013   HGBA1C 5.8 04/04/2016   MICROALBUR 0.1 05/12/2012    No results found.  Assessment & Plan:   Greg Bradley was seen today for establish care.  Diagnoses and all orders for this visit:  Cellulitis of left lower extremity -     doxycycline (VIBRA-TABS) 100 MG tablet; Take 1 tablet (100 mg total) by mouth 2 (two) times daily.   I am having Greg Bradley start on doxycycline.  Meds ordered this encounter  Medications  . doxycycline (VIBRA-TABS)  100 MG tablet    Sig: Take 1 tablet (100 mg total) by mouth 2 (two) times daily.    Dispense:  20 tablet    Refill:  0   Patient will start doxycycline and take sun precautions.  He will continue warm compresses he will follow-up probably within the week if the area does not respond to the Doxy.  Follow-up: Return in about 1 week (around 07/19/2017), or if symptoms worsen or fail to improve.  Mliss Sax, MD

## 2017-10-21 ENCOUNTER — Telehealth: Payer: Self-pay

## 2017-10-21 NOTE — Telephone Encounter (Signed)
[facility]?    Copied from CRM [phone]. Topic: Appointment Scheduling - Scheduling Inquiry for Clinic >> Oct 21, 2017  9:48 AM Arlyss Gandyichardson, Taren N, NT wrote: Reason for CRM: [facility] with Dr. Doreene BurkeKremer. Please advise if this is approved.

## 2017-10-22 NOTE — Telephone Encounter (Signed)
Okay 

## 2017-10-23 ENCOUNTER — Ambulatory Visit (INDEPENDENT_AMBULATORY_CARE_PROVIDER_SITE_OTHER): Payer: Self-pay | Admitting: Family Medicine

## 2017-10-23 ENCOUNTER — Encounter: Payer: Self-pay | Admitting: Family Medicine

## 2017-10-23 ENCOUNTER — Ambulatory Visit
Admission: RE | Admit: 2017-10-23 | Discharge: 2017-10-23 | Disposition: A | Discharge status: Home / Self Care | Payer: 59 | Source: Ambulatory Visit | Attending: Family Medicine | Admitting: Family Medicine

## 2017-10-23 ENCOUNTER — Other Ambulatory Visit: Payer: Self-pay

## 2017-10-23 VITALS — BP 118/71 | HR 67 | Temp 98.5°F | Ht 71.0 in | Wt 203.0 lb

## 2017-10-23 DIAGNOSIS — M79641 Pain in right hand: Secondary | ICD-10-CM

## 2017-10-23 DIAGNOSIS — L608 Other nail disorders: Secondary | ICD-10-CM

## 2017-10-23 DIAGNOSIS — M7989 Other specified soft tissue disorders: Secondary | ICD-10-CM | POA: Insufficient documentation

## 2017-10-23 NOTE — Progress Notes (Signed)
BP 118/71   Pulse 67   Temp 98.5 F (36.9 C) (Oral)   Ht 5\' 11"  (1.803 m)   Wt 203 lb (92.1 kg)   SpO2 96%   BMI 28.31 kg/m    Subjective:    Patient ID: Greg Bradley, male    DOB: 07-12-1985, 32 y.o.   MRN: [id number]  HPI: Greg Bradley is a 32 y.o. male  Chief Complaint  Patient presents with  . Pain    pt states his right index finger has been hurting after he was playing volleyball a month ago. pt would also like to discuss his finger nails discoloration ongoing for 3 years   Here today for right hand pain at base of index finger x 1 month since playing volleyball. Does not know of anything specific that happened to it during the game, but since has had mild pain and swelling. No redness, warmth, loss of ROM. Not taking anything OTC for it.   Also concerned about some nail discoloration he's been dealing with for 3 years off and on. Brown streaks down nails. Not painful, does not seem to affect nail quality. Requesting dermatology referral.   Relevant past medical, surgical, family and social history reviewed and updated as indicated. Interim medical history since our last visit reviewed. Allergies and medications reviewed and updated.  Review of Systems  Per HPI unless specifically indicated above     Objective:    BP 118/71   Pulse 67   Temp 98.5 F (36.9 C) (Oral)   Ht 5\' 11"  (1.803 m)   Wt 203 lb (92.1 kg)   SpO2 96%   BMI 28.31 kg/m   Wt Readings from Last 3 Encounters:  10/23/17 203 lb (92.1 kg)  07/12/17 198 lb 6 oz (90 kg)  02/27/17 200 lb (90.7 kg)    Physical Exam  Constitutional: He is oriented to person, place, and time. He appears well-developed and well-nourished.  HENT:  Head: Atraumatic.  Eyes: Conjunctivae and EOM are normal.  Neck: Normal range of motion. Neck supple.  Cardiovascular: Normal rate, regular rhythm and intact distal pulses.  Pulmonary/Chest: Effort normal and breath sounds normal.  Musculoskeletal: Normal range of  motion. He exhibits no tenderness or deformity.  Very minimal decrease in grip strength right hand Mild edema right 2nd mcp. Nontender to palpation, no redness  Neurological: He is alert and oriented to person, place, and time.  Skin: Skin is warm and dry. No erythema.  Dark brown streaks vertically on several nails  Psychiatric: He has a normal mood and affect. His behavior is normal.  Nursing note and vitals reviewed.   Results for orders placed or performed in visit on 02/27/17  Basic metabolic panel  Result Value Ref Range   Sodium 138 135 - 145 mEq/L   Potassium 3.8 3.5 - 5.1 mEq/L   Chloride 99 96 - 112 mEq/L   CO2 32 19 - 32 mEq/L   Glucose, Bld 94 70 - 99 mg/dL   BUN 15 6 - 23 mg/dL   Creatinine, Ser 7.82 0.40 - 1.50 mg/dL   Calcium 9.5 8.4 - 95.6 mg/dL   GFR 21.30 >86.57 mL/min  CBC with Differential/Platelet  Result Value Ref Range   WBC 3.5 (L) 4.0 - 10.5 K/uL   RBC 5.19 4.22 - 5.81 Mil/uL   Hemoglobin 15.2 13.0 - 17.0 g/dL   HCT 84.6 96.2 - 95.2 %   MCV 88.8 78.0 - 100.0 fl   MCHC 33.0 30.0 -  36.0 g/dL   RDW 96.013.5 45.411.5 - 09.815.5 %   Platelets 211.0 150.0 - 400.0 K/uL   Neutrophils Relative % 34.1 (L) 43.0 - 77.0 %   Lymphocytes Relative 53.0 (H) 12.0 - 46.0 %   Monocytes Relative 6.7 3.0 - 12.0 %   Eosinophils Relative 4.8 0.0 - 5.0 %   Basophils Relative 1.4 0.0 - 3.0 %   Neutro Abs 1.2 (L) 1.4 - 7.7 K/uL   Lymphs Abs 1.9 0.7 - 4.0 K/uL   Monocytes Absolute 0.2 0.1 - 1.0 K/uL   Eosinophils Absolute 0.2 0.0 - 0.7 K/uL   Basophils Absolute 0.0 0.0 - 0.1 K/uL  Hepatic function panel  Result Value Ref Range   Total Bilirubin 0.6 0.2 - 1.2 mg/dL   Bilirubin, Direct 0.1 0.0 - 0.3 mg/dL   Alkaline Phosphatase 58 39 - 117 U/L   AST 19 0 - 37 U/L   ALT 13 0 - 53 U/L   Total Protein 7.5 6.0 - 8.3 g/dL   Albumin 4.4 3.5 - 5.2 g/dL  Lipase  Result Value Ref Range   Lipase 19.0 11.0 - 59.0 U/L  C. difficile GDH and Toxin A/B  Result Value Ref Range   MICRO NUMBER:  [id number]    SPECIMEN QUALITY: ADEQUATE    Source STOOL    STATUS: FINAL    GDH ANTIGEN Detected    TOXIN A AND B Not Detected    COMMENT      Indeterminate. Specimen forwarded for toxigenic C. difficile PCR testing. For additional information, please refer to http://education.[facility].com/faq/FAQ136 (This link is being provided for informational/educational purposes only.)  Clostridium difficile Toxin B, Qualitative, Real-Time PCR  Result Value Ref Range   Toxigenic C. Difficile by PCR NOT DETECTED NOT DETECT      Assessment & Plan:   Problem List Items Addressed This Visit    None    Visit Diagnoses    Hand pain, right    -  Primary   Will get x-ray given persistence of pain and swelling. RICE, epsom salt soaks, OTC pain relievers in meantime. Pt to consider prednisone   Relevant Orders   DG Hand Complete Right (Completed)   Nail discoloration       Referral to dermatology generated today.    Relevant Orders   Ambulatory referral to Dermatology       Follow up plan: Return if symptoms worsen or fail to improve.

## 2017-10-24 ENCOUNTER — Telehealth: Payer: Self-pay | Admitting: Family Medicine

## 2017-10-24 NOTE — Telephone Encounter (Signed)
Copied from CRM [phone]. Topic: General - Other >> Oct 24, 2017  4:10 PM Tamela OddiHarris, Dmonte Maher J wrote: Reason for CRM: Patient's wife called to request the results from his hand x-ray.  Please call when the results are in.  CB# [phone].

## 2017-10-25 NOTE — Patient Instructions (Signed)
Follow up as needed

## 2017-10-25 NOTE — Telephone Encounter (Signed)
Called and left patient's wife a VM (signed DPR) asking for her to please return my call. OK for PEC to speak to patient's wife and find out what they would like to do and if they would like Prednisone sent in.

## 2017-10-25 NOTE — Telephone Encounter (Signed)
X-ray negative for fracture, just showed some soft tissue swelling that we knew about. I think we either continue with epsom salt soaks and ibuprofen prn or I can provide a burst of prednisone to help reduce inflammation and he can do the home care with soaks, heat, OTC pain relievers

## 2017-10-25 NOTE — Telephone Encounter (Signed)
Routing to provider  

## 2017-10-25 NOTE — Telephone Encounter (Signed)
Routing to provider as FYI

## 2017-10-25 NOTE — Telephone Encounter (Signed)
Patient's wife Greg Bradley called for results, results given per note Greg Maserachel Lane, PA-C below. She says she will relay the message to him and if he chooses to take Prednisone, she will call back to request the prescription.

## 2018-02-18 ENCOUNTER — Telehealth: Payer: Self-pay

## 2018-02-18 NOTE — Telephone Encounter (Signed)
Copied from CRM [phone]#[id number]. Topic: General - Other >> Feb 18, 2018 10:56 AM Lynne LoganHudson, Caryn D wrote: Reason for CRM: Pt called to cancel appt for 02/21/18 at 8am however in looking at his appt screen, there were no appts listed nor anything previously cancelled.

## 2019-01-28 ENCOUNTER — Other Ambulatory Visit: Payer: Self-pay

## 2019-01-28 ENCOUNTER — Ambulatory Visit (INDEPENDENT_AMBULATORY_CARE_PROVIDER_SITE_OTHER): Payer: Self-pay | Admitting: Family Medicine

## 2019-01-28 ENCOUNTER — Encounter: Payer: Self-pay | Admitting: Family Medicine

## 2019-01-28 DIAGNOSIS — J029 Acute pharyngitis, unspecified: Secondary | ICD-10-CM

## 2019-01-28 MED ORDER — NYSTATIN 100000 UNIT/ML MT SUSP: 5.0000 mL | Freq: Four times a day (QID) | OROMUCOSAL | 0 refills | Status: DC

## 2019-01-28 MED ORDER — AMOXICILLIN 875 MG PO TABS: 875.0000 mg | ORAL_TABLET | Freq: Two times a day (BID) | ORAL | 0 refills | Status: DC

## 2019-01-28 NOTE — Progress Notes (Signed)
There were no vitals taken for this visit.   Subjective:    Patient ID: Greg Bradley, male    DOB: November 04, 1985, 33 y.o.   MRN: [id number]  HPI: Greg Bradley is a 33 y.o. male  Chief Complaint  Patient presents with  . Sore Throat    pt states he has had swollen tonsils on the left side off and on for the past 3 to 4 months. States he has swabbed his left tonsil and got white stuff off before.     . This visit was completed via WebEx due to the restrictions of the COVID-19 pandemic. All issues as above were discussed and addressed. Physical exam was done as above through visual confirmation on WebEx. If it was felt that the patient should be evaluated in the office, they were directed there. The patient verbally consented to this visit. . Location of the patient: home . Location of the provider: work . Those involved with this call:  . Provider: Roosvelt Maser, PA-C . CMA: Elton Sin, CMA . Front Desk/Registration: Harriet Pho  . Time spent on call: 15 minutes with patient face to face via video conference. More than 50% of this time was spent in counseling and coordination of care. 5 minutes total spent in review of patient's record and preparation of their chart. I verified patient identity using two factors (patient name and date of birth). Patient consents verbally to being seen via telemedicine visit today.   3-4 months of left tonsillar swelling, tenderness and notes he can swab some white exudate from it that smells poorly. Using listerine prn with mild temporary relief. Denies fevers, chills, sweats, congestion, ear pain, N/V.   Relevant past medical, surgical, family and social history reviewed and updated as indicated. Interim medical history since our last visit reviewed. Allergies and medications reviewed and updated.  Review of Systems  Per HPI unless specifically indicated above     Objective:    There were no vitals taken for this visit.  Wt Readings from Last 3  Encounters:  10/23/17 203 lb (92.1 kg)  07/12/17 198 lb 6 oz (90 kg)  02/27/17 200 lb (90.7 kg)    Physical Exam Vitals signs and nursing note reviewed.  Constitutional:      Appearance: Normal appearance.  HENT:     Head: Atraumatic.     Nose: Nose normal.     Mouth/Throat:     Mouth: Mucous membranes are moist.     Pharynx: Oropharyngeal exudate (left tonsil) and posterior oropharyngeal erythema present.  Eyes:     Extraocular Movements: Extraocular movements intact.     Conjunctiva/sclera: Conjunctivae normal.  Neck:     Musculoskeletal: Normal range of motion.  Pulmonary:     Effort: Pulmonary effort is normal. No respiratory distress.  Musculoskeletal: Normal range of motion.  Skin:    General: Skin is warm.  Neurological:     Mental Status: He is oriented to person, place, and time.  Psychiatric:        Mood and Affect: Mood normal.        Thought Content: Thought content normal.        Judgment: Judgment normal.     Results for orders placed or performed in visit on 02/27/17  Basic metabolic panel  Result Value Ref Range   Sodium 138 135 - 145 mEq/L   Potassium 3.8 3.5 - 5.1 mEq/L   Chloride 99 96 - 112 mEq/L   CO2 32 19 - 32  mEq/L   Glucose, Bld 94 70 - 99 mg/dL   BUN 15 6 - 23 mg/dL   Creatinine, Ser 1.17 0.40 - 1.50 mg/dL   Calcium 9.5 8.4 - 10.5 mg/dL   GFR 76.59 >60.00 mL/min  CBC with Differential/Platelet  Result Value Ref Range   WBC 3.5 (L) 4.0 - 10.5 K/uL   RBC 5.19 4.22 - 5.81 Mil/uL   Hemoglobin 15.2 13.0 - 17.0 g/dL   HCT 46.0 39.0 - 52.0 %   MCV 88.8 78.0 - 100.0 fl   MCHC 33.0 30.0 - 36.0 g/dL   RDW 13.5 11.5 - 15.5 %   Platelets 211.0 150.0 - 400.0 K/uL   Neutrophils Relative % 34.1 (L) 43.0 - 77.0 %   Lymphocytes Relative 53.0 (H) 12.0 - 46.0 %   Monocytes Relative 6.7 3.0 - 12.0 %   Eosinophils Relative 4.8 0.0 - 5.0 %   Basophils Relative 1.4 0.0 - 3.0 %   Neutro Abs 1.2 (L) 1.4 - 7.7 K/uL   Lymphs Abs 1.9 0.7 - 4.0 K/uL    Monocytes Absolute 0.2 0.1 - 1.0 K/uL   Eosinophils Absolute 0.2 0.0 - 0.7 K/uL   Basophils Absolute 0.0 0.0 - 0.1 K/uL  Hepatic function panel  Result Value Ref Range   Total Bilirubin 0.6 0.2 - 1.2 mg/dL   Bilirubin, Direct 0.1 0.0 - 0.3 mg/dL   Alkaline Phosphatase 58 39 - 117 U/L   AST 19 0 - 37 U/L   ALT 13 0 - 53 U/L   Total Protein 7.5 6.0 - 8.3 g/dL   Albumin 4.4 3.5 - 5.2 g/dL  Lipase  Result Value Ref Range   Lipase 19.0 11.0 - 59.0 U/L  C. difficile GDH and Toxin A/B  Result Value Ref Range   MICRO NUMBER: [id number]    SPECIMEN QUALITY: ADEQUATE    Source STOOL    STATUS: FINAL    GDH ANTIGEN Detected    TOXIN A AND B Not Detected    COMMENT      Indeterminate. Specimen forwarded for toxigenic C. difficile PCR testing. For additional information, please refer to http://education.[facility].com/faq/FAQ136 (This link is being provided for informational/educational purposes only.)  Clostridium difficile Toxin B, Qualitative, Real-Time PCR  Result Value Ref Range   Toxigenic C. Difficile by PCR NOT DETECTED NOT DETECT      Assessment & Plan:   Problem List Items Addressed This Visit    None    Visit Diagnoses    Sore throat    -  Primary   Unclear if bacterial, yeast, or tonsil stone. Tx with amoxil, nystatin mouthwash, probiotics, salt water gargles. F/u if not improving       Follow up plan: Return if symptoms worsen or fail to improve.

## 2019-06-24 ENCOUNTER — Other Ambulatory Visit: Payer: Self-pay

## 2019-06-24 ENCOUNTER — Encounter: Payer: Self-pay | Admitting: Family Medicine

## 2019-06-24 ENCOUNTER — Ambulatory Visit (INDEPENDENT_AMBULATORY_CARE_PROVIDER_SITE_OTHER): Payer: 59 | Admitting: Family Medicine

## 2019-06-24 VITALS — BP 115/73 | HR 74 | Temp 98.1°F | Ht 70.2 in | Wt 211.0 lb

## 2019-06-24 DIAGNOSIS — Z0000 Encounter for general adult medical examination without abnormal findings: Secondary | ICD-10-CM | POA: Diagnosis not present

## 2019-06-24 LAB — UA/M W/RFLX CULTURE, ROUTINE
Bilirubin, UA: NEGATIVE
Glucose, UA: NEGATIVE
Leukocytes,UA: NEGATIVE
Nitrite, UA: NEGATIVE
Protein,UA: NEGATIVE
Specific Gravity, UA: 1.025 (ref 1.005–1.030)
Urobilinogen, Ur: 0.2 mg/dL (ref 0.2–1.0)
pH, UA: 5.5 (ref 5.0–7.5)

## 2019-06-24 LAB — MICROSCOPIC EXAMINATION
Bacteria, UA: NONE SEEN
WBC, UA: NONE SEEN /hpf (ref 0–5)

## 2019-06-24 NOTE — Progress Notes (Signed)
BP 115/73   Pulse 74   Temp 98.1 F (36.7 C) (Oral)   Ht 5' 10.2" (1.783 m)   Wt 211 lb (95.7 kg)   SpO2 95%   BMI 30.10 kg/m    Subjective:    Patient ID: Greg Bradley, male    DOB: 1985-10-22, 34 y.o.   MRN: [id number]  HPI: Greg Bradley is a 34 y.o. male presenting on 06/24/2019 for comprehensive medical examination. Current medical complaints include:none  He currently lives with: Interim Problems from his last visit: no  Depression Screen done today and results listed below:  No flowsheet data found.  The patient does not have a history of falls. I did complete a risk assessment for falls. A plan of care for falls was documented.   Past Medical History:  Past Medical History:  Diagnosis Date  . Clavicular fracture    left, football injury    Surgical History:  Past Surgical History:  Procedure Laterality Date  . NO PAST SURGERIES      Medications:  Current Outpatient Medications on File Prior to Visit  Medication Sig  . TURMERIC PO Take by mouth daily.  Marland Kitchen VITAMIN A PO Take by mouth daily.   No current facility-administered medications on file prior to visit.    Allergies:  No Known Allergies  Social History:  Social History   Socioeconomic History  . Marital status: Married    Spouse name: Not on file  . Number of children: Not on file  . Years of education: Not on file  . Highest education level: Not on file  Occupational History  . Not on file  Tobacco Use  . Smoking status: Never Smoker  . Smokeless tobacco: Never Used  Substance and Sexual Activity  . Alcohol use: No  . Drug use: No  . Sexual activity: Not on file  Other Topics Concern  . Not on file  Social History Narrative   Lives with wife and 2 children in [place]. 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[employer]      Diet - regular      Exercise - 3 days per week, [employer]   Social Determinants of Corporate investment banker Strain:     . Difficulty of Paying Living Expenses:   Food Insecurity:   . Worried About Programme researcher, broadcasting/film/video in the Last Year:   . Barista in the Last Year:   Transportation Needs:   . Freight forwarder (Medical):   Marland Kitchen Lack of Transportation (Non-Medical):   Physical Activity:   . Days of Exercise per Week:   . Minutes of Exercise per Session:   Stress:   . Feeling of Stress :   Social Connections:   . Frequency of Communication with Friends and Family:   . Frequency of Social Gatherings with Friends and Family:   . Attends Religious Services:   . Active Member of Clubs or Organizations:   . Attends Banker Meetings:   Marland Kitchen Marital Status:   Intimate Partner Violence:   . Fear of Current or Ex-Partner:   . Emotionally Abused:   Marland Kitchen Physically Abused:   . Sexually Abused:    Social History   Tobacco Use  Smoking Status Never Smoker  Smokeless Tobacco Never Used   Social History   Substance and Sexual Activity  Alcohol Use No  Family History:  Family History  Problem Relation Age of Onset  . Hypertension Paternal Grandmother   . Hypertension Father   . Hypertension Brother   . Cancer Maternal Uncle 65       colon  . Heart disease Paternal Grandfather   . Thyroid disease Mother     Past medical history, surgical history, medications, allergies, family history and social history reviewed with patient today and changes made to appropriate areas of the chart.   Review of Systems - General ROS: negative Psychological ROS: negative Ophthalmic ROS: negative ENT ROS: negative Allergy and Immunology ROS: negative Hematological and Lymphatic ROS: negative Endocrine ROS: negative Breast ROS: negative for breast lumps Respiratory ROS: no cough, shortness of breath, or wheezing Cardiovascular ROS: no chest pain or dyspnea on exertion Gastrointestinal ROS: no abdominal pain, change in bowel habits, or black or bloody stools Genito-Urinary ROS: no dysuria,  trouble voiding, or hematuria Musculoskeletal ROS: negative Neurological ROS: no TIA or stroke symptoms Dermatological ROS: negative All other ROS negative except what is listed above and in the HPI.      Objective:    BP 115/73   Pulse 74   Temp 98.1 F (36.7 C) (Oral)   Ht 5' 10.2" (1.783 m)   Wt 211 lb (95.7 kg)   SpO2 95%   BMI 30.10 kg/m   Wt Readings from Last 3 Encounters:  06/24/19 211 lb (95.7 kg)  10/23/17 203 lb (92.1 kg)  07/12/17 198 lb 6 oz (90 kg)    Physical Exam Vitals and nursing note reviewed.  Constitutional:      General: He is not in acute distress.    Appearance: He is well-developed.  HENT:     Head: Atraumatic.     Right Ear: Tympanic membrane and external ear normal.     Left Ear: Tympanic membrane and external ear normal.     Nose: Nose normal.     Mouth/Throat:     Mouth: Mucous membranes are moist.     Pharynx: Oropharynx is clear.  Eyes:     General: No scleral icterus.    Conjunctiva/sclera: Conjunctivae normal.     Pupils: Pupils are equal, round, and reactive to light.  Cardiovascular:     Rate and Rhythm: Normal rate and regular rhythm.     Heart sounds: Normal heart sounds. No murmur.  Pulmonary:     Effort: Pulmonary effort is normal. No respiratory distress.     Breath sounds: Normal breath sounds.  Abdominal:     General: Bowel sounds are normal. There is no distension.     Palpations: Abdomen is soft. There is no mass.     Tenderness: There is no abdominal tenderness. There is no guarding.  Genitourinary:    Comments: GU exam declined Musculoskeletal:        General: No tenderness. Normal range of motion.     Cervical back: Normal range of motion and neck supple.  Skin:    General: Skin is warm and dry.     Findings: No rash.  Neurological:     General: No focal deficit present.     Mental Status: He is alert and oriented to person, place, and time.     Deep Tendon Reflexes: Reflexes are normal and symmetric.    Psychiatric:        Mood and Affect: Mood normal.        Behavior: Behavior normal.        Thought Content: Thought content  normal.        Judgment: Judgment normal.     Results for orders placed or performed in visit on 02/27/17  Basic metabolic panel  Result Value Ref Range   Sodium 138 135 - 145 mEq/L   Potassium 3.8 3.5 - 5.1 mEq/L   Chloride 99 96 - 112 mEq/L   CO2 32 19 - 32 mEq/L   Glucose, Bld 94 70 - 99 mg/dL   BUN 15 6 - 23 mg/dL   Creatinine, Ser 6.50 0.40 - 1.50 mg/dL   Calcium 9.5 8.4 - 35.4 mg/dL   GFR 65.68 >12.75 mL/min  CBC with Differential/Platelet  Result Value Ref Range   WBC 3.5 (L) 4.0 - 10.5 K/uL   RBC 5.19 4.22 - 5.81 Mil/uL   Hemoglobin 15.2 13.0 - 17.0 g/dL   HCT 17.0 01.7 - 49.4 %   MCV 88.8 78.0 - 100.0 fl   MCHC 33.0 30.0 - 36.0 g/dL   RDW 49.6 75.9 - 16.3 %   Platelets 211.0 150.0 - 400.0 K/uL   Neutrophils Relative % 34.1 (L) 43.0 - 77.0 %   Lymphocytes Relative 53.0 (H) 12.0 - 46.0 %   Monocytes Relative 6.7 3.0 - 12.0 %   Eosinophils Relative 4.8 0.0 - 5.0 %   Basophils Relative 1.4 0.0 - 3.0 %   Neutro Abs 1.2 (L) 1.4 - 7.7 K/uL   Lymphs Abs 1.9 0.7 - 4.0 K/uL   Monocytes Absolute 0.2 0.1 - 1.0 K/uL   Eosinophils Absolute 0.2 0.0 - 0.7 K/uL   Basophils Absolute 0.0 0.0 - 0.1 K/uL  Hepatic function panel  Result Value Ref Range   Total Bilirubin 0.6 0.2 - 1.2 mg/dL   Bilirubin, Direct 0.1 0.0 - 0.3 mg/dL   Alkaline Phosphatase 58 39 - 117 U/L   AST 19 0 - 37 U/L   ALT 13 0 - 53 U/L   Total Protein 7.5 6.0 - 8.3 g/dL   Albumin 4.4 3.5 - 5.2 g/dL  Lipase  Result Value Ref Range   Lipase 19.0 11.0 - 59.0 U/L  C. difficile GDH and Toxin A/B  Result Value Ref Range   MICRO NUMBER: [id number]    SPECIMEN QUALITY: ADEQUATE    Source STOOL    STATUS: FINAL    GDH ANTIGEN Detected    TOXIN A AND B Not Detected    COMMENT      Indeterminate. Specimen forwarded for toxigenic C. difficile PCR testing. For additional information,  please refer to http://education.[facility].com/faq/FAQ136 (This link is being provided for informational/educational purposes only.)  Clostridium difficile Toxin B, Qualitative, Real-Time PCR  Result Value Ref Range   Toxigenic C. Difficile by PCR NOT DETECTED NOT DETECT      Assessment & Plan:   Problem List Items Addressed This Visit      Other   Annual physical exam - Primary   Relevant Orders   CBC with Differential/Platelet   Comprehensive metabolic panel   Lipid Panel w/o Chol/HDL Ratio   TSH   UA/M w/rflx Culture, Routine       Discussed aspirin prophylaxis for myocardial infarction prevention and decision was it was not indicated  LABORATORY TESTING:  Health maintenance labs ordered today as discussed above.   The natural history of prostate cancer and ongoing controversy regarding screening and potential treatment outcomes of prostate cancer has been discussed with the patient. The meaning of a false positive PSA and a false negative PSA has been discussed. He indicates understanding  of the limitations of this screening test and wishes not to proceed with screening PSA testing.   IMMUNIZATIONS:   - Tdap: Tetanus vaccination status reviewed: last tetanus booster within 10 years. - Influenza: Postponed to flu season  PATIENT COUNSELING:    Sexuality: Discussed sexually transmitted diseases, partner selection, use of condoms, avoidance of unintended pregnancy  and contraceptive alternatives.   Advised to avoid cigarette smoking.  I discussed with the patient that most people either abstain from alcohol or drink within safe limits (<=14/week and <=4 drinks/occasion for males, <=7/weeks and <= 3 drinks/occasion for females) and that the risk for alcohol disorders and other health effects rises proportionally with the number of drinks per week and how often a drinker exceeds daily limits.  Discussed cessation/primary prevention of drug use and availability of  treatment for abuse.   Diet: Encouraged to adjust caloric intake to maintain  or achieve ideal body weight, to reduce intake of dietary saturated fat and total fat, to limit sodium intake by avoiding high sodium foods and not adding table salt, and to maintain adequate dietary potassium and calcium preferably from fresh fruits, vegetables, and low-fat dairy products.    stressed the importance of regular exercise  Injury prevention: Discussed safety belts, safety helmets, smoke detector, smoking near bedding or upholstery.   Dental health: Discussed importance of regular tooth brushing, flossing, and dental visits.   Follow up plan: NEXT PREVENTATIVE PHYSICAL DUE IN 1 YEAR. Return in about 1 year (around 06/23/2020) for CPE.

## 2019-06-25 LAB — LIPID PANEL W/O CHOL/HDL RATIO
Cholesterol, Total: 150 mg/dL (ref 100–199)
HDL: 63 mg/dL (ref >39)
LDL Chol Calc (NIH): 73 mg/dL (ref 0–99)
Triglycerides: 69 mg/dL (ref 0–149)
VLDL Cholesterol Cal: 14 mg/dL (ref 5–40)

## 2019-06-25 LAB — COMPREHENSIVE METABOLIC PANEL
ALT: 16 IU/L (ref 0–44)
AST: 23 IU/L (ref 0–40)
Albumin/Globulin Ratio: 1.6 (ref 1.2–2.2)
Albumin: 4.5 g/dL (ref 4.0–5.0)
Alkaline Phosphatase: 73 IU/L (ref 39–117)
BUN/Creatinine Ratio: 11 (ref 9–20)
BUN: 14 mg/dL (ref 6–20)
Bilirubin Total: 0.4 mg/dL (ref 0.0–1.2)
CO2: 23 mmol/L (ref 20–29)
Calcium: 9.3 mg/dL (ref 8.7–10.2)
Chloride: 99 mmol/L (ref 96–106)
Creatinine, Ser: 1.24 mg/dL (ref 0.76–1.27)
GFR calc Af Amer: 87 mL/min/{1.73_m2} (ref >59)
GFR calc non Af Amer: 75 mL/min/{1.73_m2} (ref >59)
Globulin, Total: 2.8 g/dL (ref 1.5–4.5)
Glucose: 91 mg/dL (ref 65–99)
Potassium: 4.2 mmol/L (ref 3.5–5.2)
Sodium: 138 mmol/L (ref 134–144)
Total Protein: 7.3 g/dL (ref 6.0–8.5)

## 2019-06-25 LAB — CBC WITH DIFFERENTIAL/PLATELET
Basophils Absolute: 0.1 10*3/uL (ref 0.0–0.2)
Basos: 1 %
EOS (ABSOLUTE): 0.2 10*3/uL (ref 0.0–0.4)
Eos: 5 %
Hematocrit: 48.4 % (ref 37.5–51.0)
Hemoglobin: 15.9 g/dL (ref 13.0–17.7)
Immature Grans (Abs): 0 10*3/uL (ref 0.0–0.1)
Immature Granulocytes: 0 %
Lymphocytes Absolute: 1.8 10*3/uL (ref 0.7–3.1)
Lymphs: 39 %
MCH: 29.6 pg (ref 26.6–33.0)
MCHC: 32.9 g/dL (ref 31.5–35.7)
MCV: 90 fL (ref 79–97)
Monocytes Absolute: 0.4 10*3/uL (ref 0.1–0.9)
Monocytes: 9 %
Neutrophils Absolute: 2.1 10*3/uL (ref 1.4–7.0)
Neutrophils: 46 %
Platelets: 201 10*3/uL (ref 150–450)
RBC: 5.38 x10E6/uL (ref 4.14–5.80)
RDW: 13 % (ref 11.6–15.4)
WBC: 4.6 10*3/uL (ref 3.4–10.8)

## 2019-06-25 LAB — TSH: TSH: 0.87 u[IU]/mL (ref 0.450–4.500)

## 2019-10-15 ENCOUNTER — Encounter: Payer: Self-pay | Admitting: Family Medicine

## 2019-10-28 DIAGNOSIS — U071 COVID-19: Secondary | ICD-10-CM

## 2019-10-28 HISTORY — DX: COVID-19: U07.1

## 2020-01-10 IMAGING — DX DG HAND COMPLETE 3+V*R*
3 series · 3 of 3 positions shown · non-contrast
Comparison: None.

CLINICAL DATA: Pain and swelling, second metacarpal.

EXAM:
RIGHT HAND - COMPLETE 3+ VIEW

[hand ap]
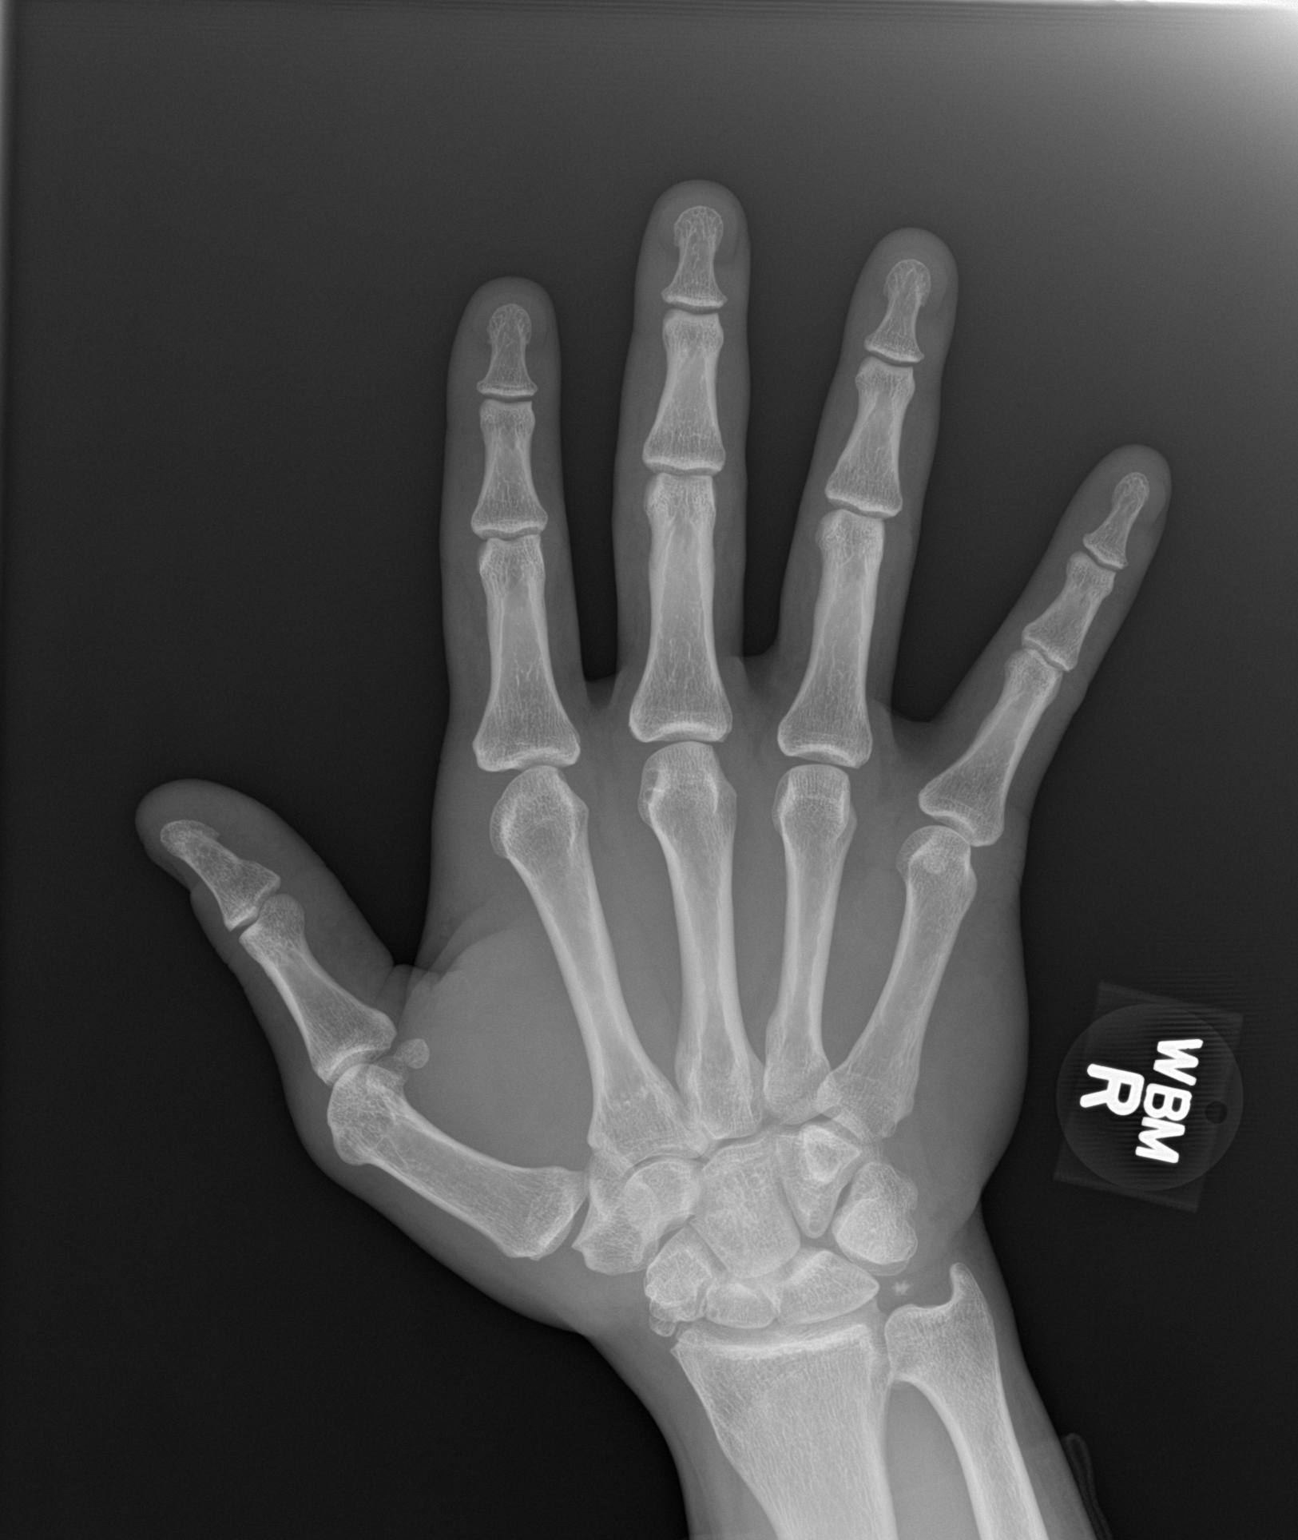

[hand obl]
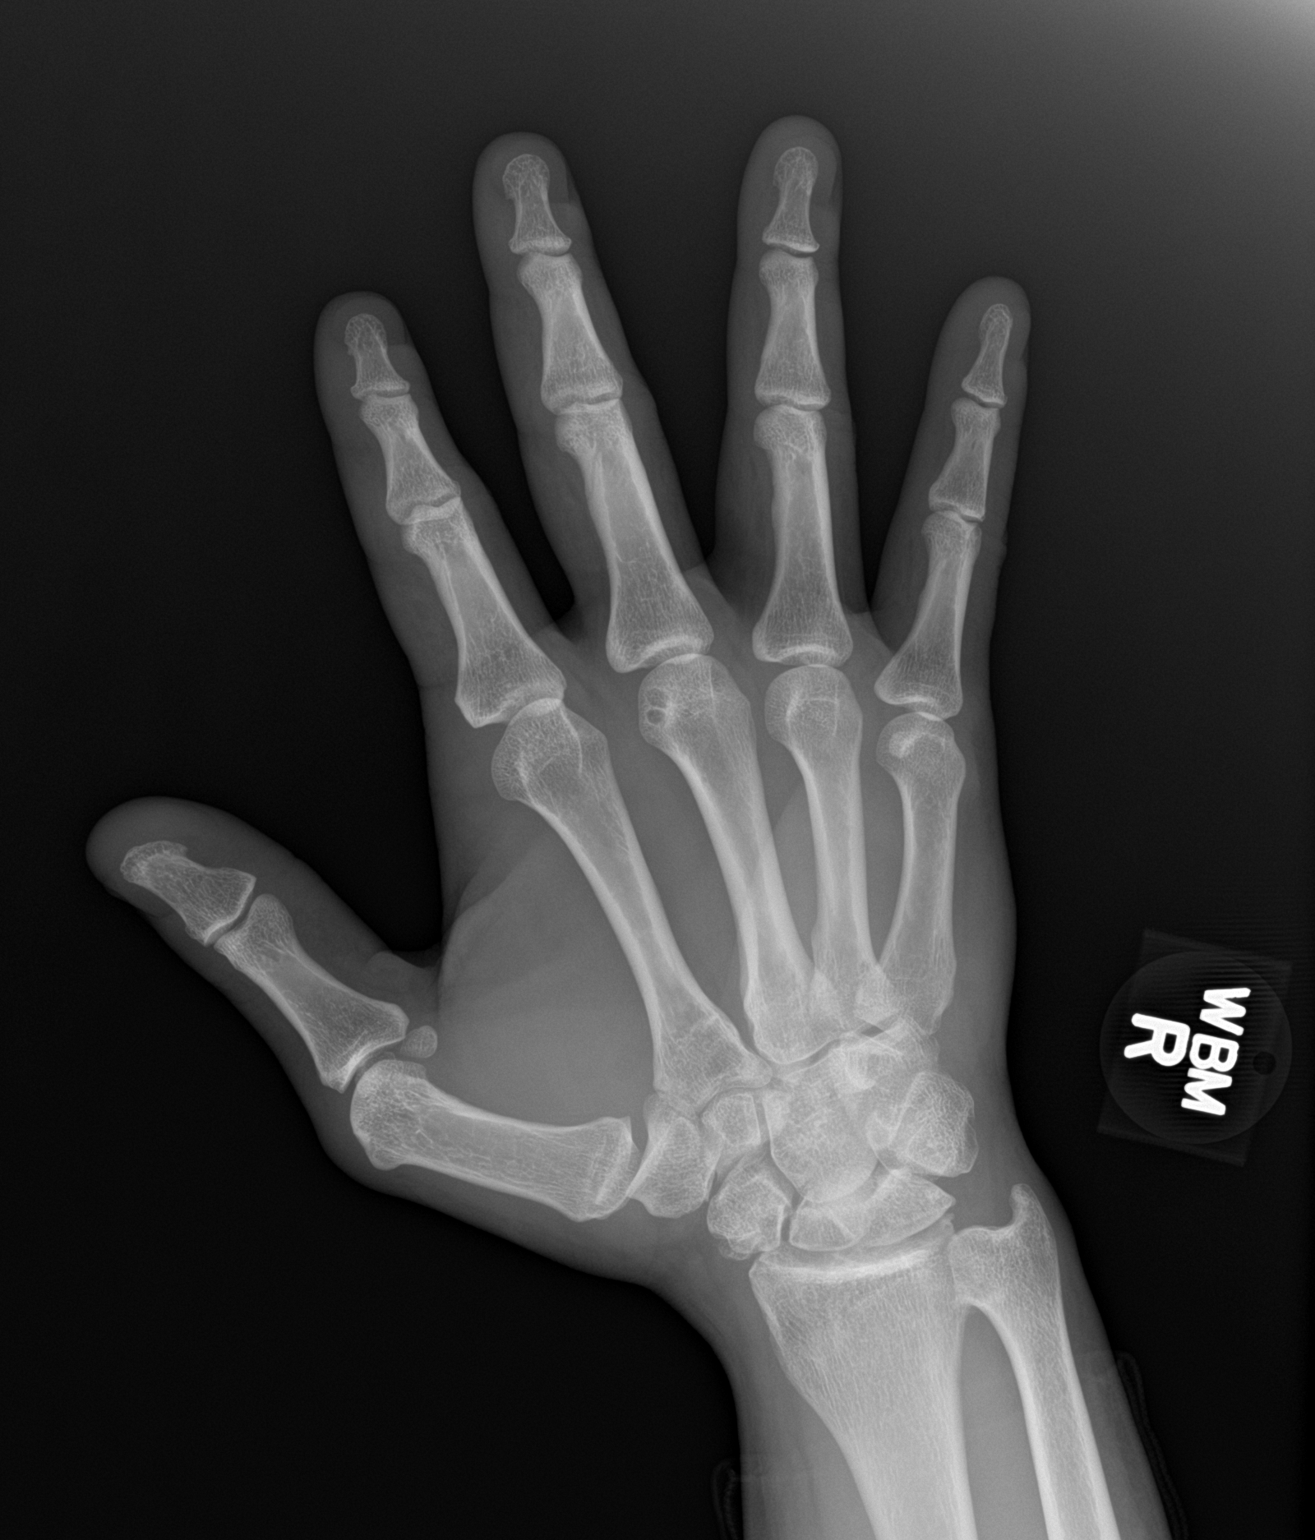

[hand lat]
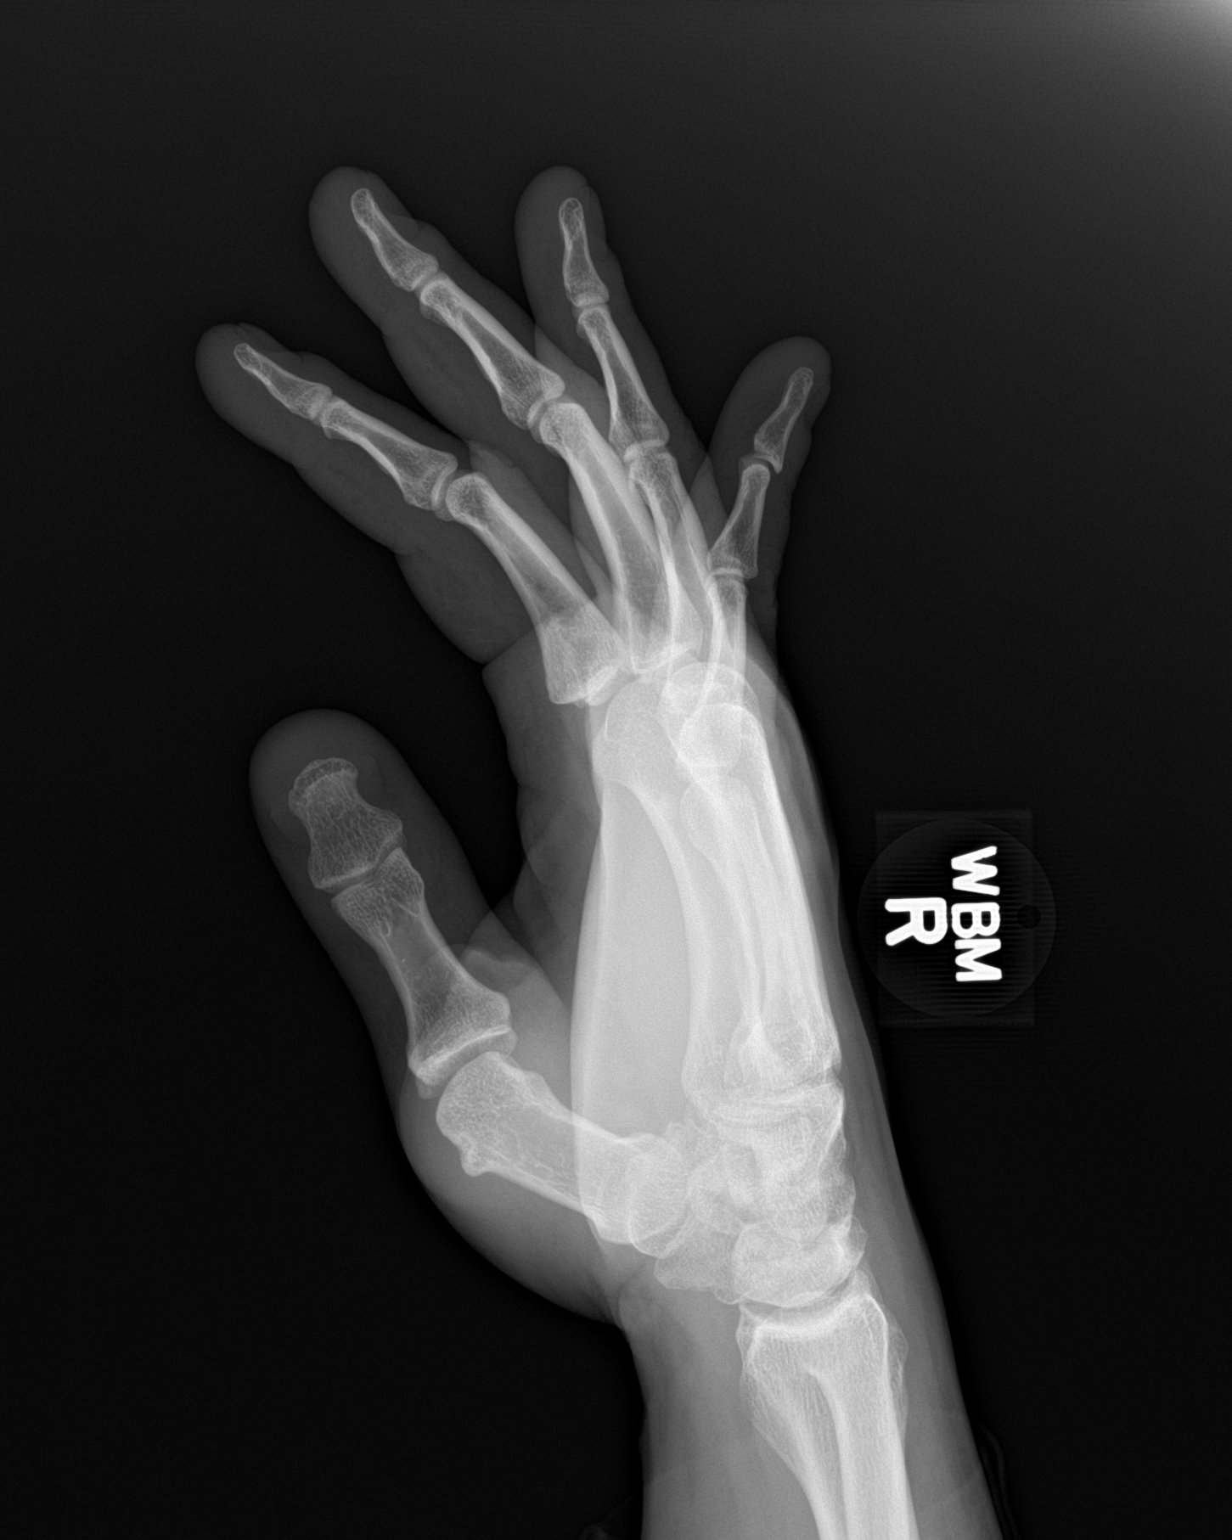

[3 of 3 positions shown; findings below may reference images not displayed]

FINDINGS: There is no evidence of fracture or dislocation. There is no
evidence of arthropathy or other focal bone abnormality. Mild soft
tissue swelling.
IMPRESSION: No osseous findings.  Mild soft tissue swelling.

## 2020-03-01 ENCOUNTER — Encounter: Payer: Self-pay | Admitting: Nurse Practitioner

## 2020-03-01 ENCOUNTER — Other Ambulatory Visit: Payer: Self-pay | Admitting: Nurse Practitioner

## 2020-03-01 ENCOUNTER — Other Ambulatory Visit: Payer: Self-pay

## 2020-03-01 ENCOUNTER — Ambulatory Visit (INDEPENDENT_AMBULATORY_CARE_PROVIDER_SITE_OTHER): Payer: No Typology Code available for payment source | Admitting: Nurse Practitioner

## 2020-03-01 VITALS — BP 126/80 | HR 83 | Temp 98.0°F | Ht 68.9 in | Wt 218.8 lb

## 2020-03-01 DIAGNOSIS — K629 Disease of anus and rectum, unspecified: Secondary | ICD-10-CM

## 2020-03-01 MED ORDER — IMIQUIMOD 5 % EX CREA: TOPICAL_CREAM | CUTANEOUS | 0 refills | Status: DC

## 2020-03-01 MED ORDER — IMIQUIMOD 3.75 % EX CREA: TOPICAL_CREAM | CUTANEOUS | 2 refills | Status: DC

## 2020-03-01 NOTE — Progress Notes (Signed)
BP 126/80   Pulse 83   Temp 98 F (36.7 C) (Oral)   Ht 5' 8.9" (1.75 m)   Wt 218 lb 12.8 oz (99.2 kg)   SpO2 97%   BMI 32.41 kg/m    Subjective:    Patient ID: Greg Bradley, male    DOB: [birth year], 35 y.o.   MRN: [id number]  HPI: Greg Bradley is a 35 y.o. male  Chief Complaint  Patient presents with  . Anal Lesion    Started about 5 to 6 months ago, started to feeling something like a skin tag. Not Painful   ANAL LESION Reports it looks like a little skin tag -- has been present for 6 months.  On occasion area is itchy and he reports it is whitish in color.  No burning sensation, pain, or bleeding.  Does have a history of hemorrhoids in teen years, this occasionally will "pop out" with BM. Lesion he feels has grown a little. Currently sexually active and in monogamous relationship with wife.  No history of anal intercourse.   Denies history of STD. Duration: 6 months Location: anal area Painful: no Itching: yes Onset: sudden Context: slightly bigger Associated signs and symptoms: as above History of skin cancer: no History of precancerous skin lesions: no Family history of skin cancer: no  Relevant past medical, surgical, family and social history reviewed and updated as indicated. Interim medical history since our last visit reviewed. Allergies and medications reviewed and updated.  Review of Systems  Constitutional: Negative for activity change, diaphoresis, fatigue and fever.  Respiratory: Negative for cough, chest tightness, shortness of breath and wheezing.   Cardiovascular: Negative for chest pain, palpitations and leg swelling.  Gastrointestinal: Negative.   Skin: Positive for color change.  Neurological: Negative.   Psychiatric/Behavioral: Negative.     Per HPI unless specifically indicated above     Objective:    BP 126/80   Pulse 83   Temp 98 F (36.7 C) (Oral)   Ht 5' 8.9" (1.75 m)   Wt 218 lb 12.8 oz (99.2 kg)   SpO2 97%   BMI 32.41 kg/m    Wt Readings from Last 3 Encounters:  03/01/20 218 lb 12.8 oz (99.2 kg)  06/24/19 211 lb (95.7 kg)  10/23/17 203 lb (92.1 kg)    Physical Exam Vitals and nursing note reviewed.  Constitutional:      General: He is awake. He is not in acute distress.    Appearance: He is well-developed and well-groomed. He is obese. He is not ill-appearing.  HENT:     Head: Normocephalic and atraumatic.     Right Ear: Hearing normal. No drainage.     Left Ear: Hearing normal. No drainage.  Eyes:     General: Lids are normal.        Right eye: No discharge.        Left eye: No discharge.     Conjunctiva/sclera: Conjunctivae normal.     Pupils: Pupils are equal, round, and reactive to light.  Neck:     Trachea: Trachea normal.  Cardiovascular:     Rate and Rhythm: Normal rate and regular rhythm.     Heart sounds: Normal heart sounds, S1 normal and S2 normal. No murmur heard. No gallop.   Pulmonary:     Effort: Pulmonary effort is normal. No accessory muscle usage or respiratory distress.     Breath sounds: Normal breath sounds.  Abdominal:     General: Bowel sounds are normal.  Palpations: Abdomen is soft.  Genitourinary:    Rectum: No external hemorrhoid.    Musculoskeletal:        General: Normal range of motion.     Cervical back: Normal range of motion and neck supple.     Right lower leg: No edema.     Left lower leg: No edema.  Skin:    General: Skin is warm and dry.     Capillary Refill: Capillary refill takes less than 2 seconds.     Findings: No rash.  Neurological:     Mental Status: He is alert and oriented to person, place, and time.     Deep Tendon Reflexes: Reflexes are normal and symmetric.  Psychiatric:        Attention and Perception: Attention normal.        Mood and Affect: Mood normal.        Speech: Speech normal.        Behavior: Behavior normal. Behavior is cooperative.        Thought Content: Thought content normal.     Results for orders placed or  performed in visit on 06/24/19  Microscopic Examination   URINE  Result Value Ref Range   WBC, UA None seen 0 - 5 /hpf   RBC 0-2 0 - 2 /hpf   Epithelial Cells (non renal) 0-10 0 - 10 /hpf   Bacteria, UA None seen None seen/Few  CBC with Differential/Platelet  Result Value Ref Range   WBC 4.6 3.4 - 10.8 x10E3/uL   RBC 5.38 4.14 - 5.80 x10E6/uL   Hemoglobin 15.9 13.0 - 17.7 g/dL   Hematocrit 93.2 35.5 - 51.0 %   MCV 90 79 - 97 fL   MCH 29.6 26.6 - 33.0 pg   MCHC 32.9 31.5 - 35.7 g/dL   RDW 73.2 20.2 - 54.2 %   Platelets 201 150 - 450 x10E3/uL   Neutrophils 46 Not Estab. %   Lymphs 39 Not Estab. %   Monocytes 9 Not Estab. %   Eos 5 Not Estab. %   Basos 1 Not Estab. %   Neutrophils Absolute 2.1 1.4 - 7.0 x10E3/uL   Lymphocytes Absolute 1.8 0.7 - 3.1 x10E3/uL   Monocytes Absolute 0.4 0.1 - 0.9 x10E3/uL   EOS (ABSOLUTE) 0.2 0.0 - 0.4 x10E3/uL   Basophils Absolute 0.1 0.0 - 0.2 x10E3/uL   Immature Granulocytes 0 Not Estab. %   Immature Grans (Abs) 0.0 0.0 - 0.1 x10E3/uL  Comprehensive metabolic panel  Result Value Ref Range   Glucose 91 65 - 99 mg/dL   BUN 14 6 - 20 mg/dL   Creatinine, Ser 7.06 0.76 - 1.27 mg/dL   GFR calc non Af Amer 75 >59 mL/min/1.73   GFR calc Af Amer 87 >59 mL/min/1.73   BUN/Creatinine Ratio 11 9 - 20   Sodium 138 134 - 144 mmol/L   Potassium 4.2 3.5 - 5.2 mmol/L   Chloride 99 96 - 106 mmol/L   CO2 23 20 - 29 mmol/L   Calcium 9.3 8.7 - 10.2 mg/dL   Total Protein 7.3 6.0 - 8.5 g/dL   Albumin 4.5 4.0 - 5.0 g/dL   Globulin, Total 2.8 1.5 - 4.5 g/dL   Albumin/Globulin Ratio 1.6 1.2 - 2.2   Bilirubin Total 0.4 0.0 - 1.2 mg/dL   Alkaline Phosphatase 73 39 - 117 IU/L   AST 23 0 - 40 IU/L   ALT 16 0 - 44 IU/L  Lipid Panel w/o Chol/HDL Ratio  Result Value Ref Range   Cholesterol, Total 150 100 - 199 mg/dL   Triglycerides 69 0 - 149 mg/dL   HDL 63 >39 mg/dL   VLDL Cholesterol Cal 14 5 - 40 mg/dL   LDL Chol Calc (NIH) 73 0 - 99 mg/dL  TSH  Result Value  Ref Range   TSH 0.870 0.450 - 4.500 uIU/mL  UA/M w/rflx Culture, Routine   Specimen: Urine   URINE  Result Value Ref Range   Specific Gravity, UA 1.025 1.005 - 1.030   pH, UA 5.5 5.0 - 7.5   Color, UA Yellow Yellow   Appearance Ur Clear Clear   Leukocytes,UA Negative Negative   Protein,UA Negative Negative/Trace   Glucose, UA Negative Negative   Ketones, UA Trace (A) Negative   RBC, UA Trace (A) Negative   Bilirubin, UA Negative Negative   Urobilinogen, Ur 0.2 0.2 - 1.0 mg/dL   Nitrite, UA Negative Negative   Microscopic Examination See below:       Assessment & Plan:   Problem List Items Addressed This Visit      Digestive   Anal lesion - Primary    Acute, present x 6 months.  No history of STD.  In monogamous relationship per report.  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  Will treat with Imiquimod cream, script sent, and place referral to dermatology for further assessment of area.  Educated patient on condyloma and provided instructions on how to care for area.  Return to office for worsening or ongoing symptoms.      Relevant Orders   Ambulatory referral to Dermatology       Follow up plan: Return if symptoms worsen or fail to improve.

## 2020-03-01 NOTE — Patient Instructions (Addendum)
Genital Warts Genital warts are small growths in the area around the genitals or the anus. They are caused by a type of germ (HPV virus). This germ is spread from person to person during sex. It can be spread through vaginal, anal, and oral sex. Genital warts can lead to other problems if they are not treated. A person is more likely to have this condition if he or she:  Has sex without using a condom.  Has sex with many people.  Has sex before the age of 36.  Has a weak body defense (immune) system. This condition can be treated with medicines. Your doctor may also burn or freeze the warts. In some cases, surgery may be done to remove the warts. Follow these instructions at home: Medicines   Apply over-the-counter and prescription medicines only as told by your doctor.  Do not use medicines that are meant for treating hand warts.  Talk with your doctor about using creams to treat itching. Instructions for women  Plan to have regular tests to check for cervical cancer. Your risk for this cancer increases when you have genital warts.  If you become pregnant, tell your doctor that you have had genital warts. The germ can be passed to the baby. General instructions  Do not touch or scratch the warts.  Do not have sex until your treatment is done.  Tell your current and past sexual partners about your condition. They may need treatment.  After treatment, use condoms during sex.  Keep all follow-up visits as told by your doctor. This is important. How is this prevented? Talk with your doctor about getting the HPV shot. The HPV shot:  Can help stop some HPV infections and cancers.  Is given to males and females who are 36-36 years old.  Will not work if you already have HPV.  Is not recommended for pregnant women. Contact a doctor if:  You have redness, swelling, or pain in the area of the treated skin.  You have a fever.  You feel sick.  You feel lumps in the area  around your genitals or anus.  You have bleeding in the area around your genitals or anus.  You have pain during sex. Summary  Genital warts are small growths in the areas around the genitals or the anus. They are caused by a type of germ (HPV virus).  The germ is spread by having vaginal, anal, or oral sex without using a condom.  This condition is treated using medicines. In some cases, freezing, burning, or surgery may be done to get rid of the warts.  This condition may be prevented by getting a HPV shot. This information is not intended to replace advice given to you by your health care provider. Make sure you discuss any questions you have with your health care provider. Document Revised: 03/19/2017 Document Reviewed: 03/19/2017 Elsevier Patient Education  2020 Elsevier Inc.  Imiquimod skin cream What is this medicine? IMIQUIMOD (i mi KWI mod) cream is used to treat external genital or anal warts. It is also used to treat other skin conditions such as actinic keratosis and certain types of skin cancer. This medicine may be used for other purposes; ask your health care provider or pharmacist if you have questions. COMMON BRAND NAME(S): Celesta Aver What should I tell my health care provider before I take this medicine? They need to know if you have any of these conditions:  decreased immune function  an unusual or allergic reaction  to imiquimod, other medicines, foods, dyes, or preservatives  pregnant or trying to get pregnant  breast-feeding How should I use this medicine? This medicine is for external use only. Do not take by mouth. Follow the directions on the prescription label. Apply just before bedtime. Wash your hands before and after use. Apply a thin layer of cream and massage gently into the affected areas until no longer visible. Do not use in the mouth, eyes or the vagina. Use this medicine only on the affected area as directed by your health care provider. Do  not use for longer than prescribed. It is important not to use more medicine than prescribed. To do so may increase the chance of side effects. Talk to your pediatrician regarding the use of this medicine in children. While this drug may be prescribed for children as young as 36 years of age for selected conditions, precautions do apply. Overdosage: If you think you have taken too much of this medicine contact a poison control center or emergency room at once. NOTE: This medicine is only for you. Do not share this medicine with others. What if I miss a dose? If you miss a dose, use it as soon as you can. If it is almost time for your next dose, use only that dose. Do not use double or extra doses. What may interact with this medicine? Interactions are not expected. Do not use any other medicines on the treated area without asking your doctor or health care professional. This list may not describe all possible interactions. Give your health care provider a list of all the medicines, herbs, non-prescription drugs, or dietary supplements you use. Also tell them if you smoke, drink alcohol, or use illegal drugs. Some items may interact with your medicine. What should I watch for while using this medicine? Visit your health care professional for regular checks on your progress. Do not use this medicine until the skin has healed from any other drug (example: podofilox or podophyllin resin) or surgical skin treatment. Females should receive regular pelvic exams while being treated for genital warts. Most patients see improvement within 4 weeks. It may take up to 16 weeks to see a full clearing of the warts. This medicine is not a cure. New warts may develop during or after treatment. Avoid sexual (genital, anal, oral) contact while the cream is on the skin. If warts are visible in the genital area, sexual contact should be avoided until the warts are treated. The use of latex condoms during sexual contact may  reduce, but not entirely prevent, infecting others. This medicine may weaken condoms, diaphragms, cervical caps or other barrier devices and make them less effective as birth control. Do not cover the treated area with an airtight bandage. Cotton gauze dressings can be used. Cotton underwear can be worn after using this medicine on the genital or anal area. Actinic keratoses that were not seen before may appear during treatment and may later go away. The treatment area and surrounding area may lighten or darken after treatment with this medicine. These skin color changes may be permanent in some patients. If you experience a skin reaction at the treatment site that interferes or prevents you from doing any daily activity, contact your health care provider. You may need a rest period from treatment. Treatment may be restarted once the reaction has gotten better as recommended by your doctor or health care professional. This medicine can make you more sensitive to the sun. Keep out of  the sun. If you cannot avoid being in the sun, wear protective clothing and use sunscreen. Do not use sun lamps or tanning beds/booths. What side effects may I notice from receiving this medicine? Side effects that you should report to your doctor or health care professional as soon as possible:  open sores with or without drainage  skin infection  skin rash  unusual or severe skin reaction Side effects that usually do not require medical attention (report to your doctor or health care professional if they continue or are bothersome):  burning or itching  redness of the skin (very common but is usually not painful or harmful)  scabbing, crusting, or peeling skin  skin that becomes hard or thickened  swelling of the skin This list may not describe all possible side effects. Call your doctor for medical advice about side effects. You may report side effects to FDA at 1-800-FDA-1088. Where should I keep my  medicine? Keep out of the reach of children. Store between 4 and 25 degrees C (39 and 77 degrees F). Do not freeze. Throw away any unused medicine after the expiration date. Discard packet after applying to affected area. Partial packets should not be saved or reused. NOTE: This sheet is a summary. It may not cover all possible information. If you have questions about this medicine, talk to your doctor, pharmacist, or health care provider.  2020 Elsevier/Gold Standard (2008-01-27 10:33:25)

## 2020-03-01 NOTE — Telephone Encounter (Signed)
Please call pharmacy and see if alternative for anal wart treatment available, this is often the regimen we order.

## 2020-03-01 NOTE — Telephone Encounter (Signed)
Spoke to Dr. Laural Benes too, this is only cream we send in for this unfortunately.  Looks like he is scheduled to see dermatology, I would recommend he keep this appointment and they may have alternate options that insurance may cover.

## 2020-03-01 NOTE — Telephone Encounter (Signed)
Is there an alternative?

## 2020-03-01 NOTE — Telephone Encounter (Signed)
   Notes to clinic:  Alternative Requested:DRUG NOT COVERED BY INSURANCE AND IS $1200   Requested Prescriptions  Pending Prescriptions Disp Refills   Imiquimod 3.75 % CREA [Pharmacy Med Name: IMIQUIMOD 3.75% CREAM PUMP] 7.5 g 2    Sig: Apply a thin layer once daily prior to bedtime; leave on skin for 8 hours, then remove with mild soap and water in shower. Continue treatment until there is total clearance of the wart (maximum duration of treatment no more than 8 weeks).      Off-Protocol Failed - 03/01/2020 10:23 AM      Failed - Medication not assigned to a protocol, review manually.      Passed - Valid encounter within last 12 months    Recent Outpatient Visits           Today Anal lesion   [facility] Marjie Skiff, NP   8 months ago Annual physical exam   [facility] Particia Nearing, [place]   1 year ago Sore throat   [facility] Particia Nearing, [place]   2 years ago Hand pain, right   [facility] [place], Salley Hews, [place]       Future Appointments             In 3 months Laural Benes, Oralia Rud, DO [employer], [facility]

## 2020-03-01 NOTE — Telephone Encounter (Signed)
Sending to pool

## 2020-03-01 NOTE — Assessment & Plan Note (Signed)
Acute, present x 6 months.  No history of STD.  In monogamous relationship per report.  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  Will treat with Imiquimod cream, script sent, and place referral to dermatology for further assessment of area.  Educated patient on condyloma and provided instructions on how to care for area.  Return to office for worsening or ongoing symptoms.

## 2020-03-01 NOTE — Telephone Encounter (Signed)
Patient notified

## 2020-03-02 ENCOUNTER — Ambulatory Visit: Payer: 59 | Admitting: Nurse Practitioner

## 2020-06-23 ENCOUNTER — Encounter: Payer: 59 | Admitting: Family Medicine

## 2020-07-06 ENCOUNTER — Encounter: Payer: Self-pay | Admitting: Nurse Practitioner

## 2020-07-06 ENCOUNTER — Other Ambulatory Visit: Payer: Self-pay

## 2020-07-06 ENCOUNTER — Ambulatory Visit (INDEPENDENT_AMBULATORY_CARE_PROVIDER_SITE_OTHER): Payer: No Typology Code available for payment source | Admitting: Nurse Practitioner

## 2020-07-06 VITALS — BP 122/74 | HR 67 | Temp 98.0°F | Ht 69.8 in | Wt 220.2 lb

## 2020-07-06 DIAGNOSIS — Z0000 Encounter for general adult medical examination without abnormal findings: Secondary | ICD-10-CM

## 2020-07-06 DIAGNOSIS — Z114 Encounter for screening for human immunodeficiency virus [HIV]: Secondary | ICD-10-CM | POA: Diagnosis not present

## 2020-07-06 DIAGNOSIS — Z1159 Encounter for screening for other viral diseases: Secondary | ICD-10-CM

## 2020-07-06 LAB — URINALYSIS, ROUTINE W REFLEX MICROSCOPIC
Bilirubin, UA: NEGATIVE
Glucose, UA: NEGATIVE
Ketones, UA: NEGATIVE
Leukocytes,UA: NEGATIVE
Nitrite, UA: NEGATIVE
Protein,UA: NEGATIVE
RBC, UA: NEGATIVE
Specific Gravity, UA: 1.02 (ref 1.005–1.030)
Urobilinogen, Ur: 0.2 mg/dL (ref 0.2–1.0)
pH, UA: 6 (ref 5.0–7.5)

## 2020-07-06 NOTE — Progress Notes (Signed)
BP 122/74   Pulse 67   Temp 98 F (36.7 C)   Ht 5' 9.8" (1.773 m)   Wt 220 lb 4 oz (99.9 kg)   SpO2 96%   BMI 31.78 kg/m    Subjective:    Patient ID: Greg Bradley, male    DOB: [birth year], 35 y.o.   MRN: [id number]  HPI: Greg Bradley is a 35 y.o. male presenting on 07/06/2020 for comprehensive medical examination. Current medical complaints include:none  He currently lives with: Interim Problems from his last visit: no  Denies HA, CP, SOB, dizziness, palpitations, visual changes, and lower extremity swelling.   Depression Screen done today and results listed below:  Depression screen [facility] 2/9 03/01/2020 06/24/2019  Decreased Interest 0 0  Down, Depressed, Hopeless 0 0  PHQ - 2 Score 0 0  Altered sleeping - 0  Tired, decreased energy - 0  Change in appetite - 0  Feeling bad or failure about yourself  - 0  Trouble concentrating - 0  Moving slowly or fidgety/restless - 0  Suicidal thoughts - 0  PHQ-9 Score - 0    The patient does not have a history of falls. I did not complete a risk assessment for falls. A plan of care for falls was documented.   Past Medical History:  Past Medical History:  Diagnosis Date  . Clavicular fracture    left, football injury  . COVID 10/2019    Surgical History:  Past Surgical History:  Procedure Laterality Date  . NO PAST SURGERIES      Medications:  Current Outpatient Medications on File Prior to Visit  Medication Sig  . loratadine (CLARITIN) 10 MG tablet Take 10 mg by mouth daily.   No current facility-administered medications on file prior to visit.    Allergies:  No Known Allergies  Social History:  Social History   Socioeconomic History  . Marital status: Married    Spouse name: Not on file  . Number of children: Not on file  . Years of education: Not on file  . Highest education level: Not on file  Occupational History  . Not on file  Tobacco Use  . Smoking status: Never Smoker  . Smokeless tobacco: Never  Used  Vaping Use  . Vaping Use: Never used  Substance and Sexual Activity  . Alcohol use: No  . Drug use: No  . Sexual activity: Not on file  Other Topics Concern  . Not on file  Social History Narrative   Lives with wife and 2 children in [place]. 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[employer]      Diet - regular      Exercise - 3 days per week, Artist   Social Determinants of Corporate investment banker Strain: Not on [employer] Insecurity: Not on file  Transportation Needs: Not on file  Physical Activity: Not on file  Stress: Not on file  Social Connections: Not on file  Intimate Partner Violence: Not on file   Social History   Tobacco Use  Smoking Status Never Smoker  Smokeless Tobacco Never Used   Social History   Substance and Sexual Activity  Alcohol Use No    Family History:  Family History  Problem Relation Age of Onset  . Hypertension Paternal Grandmother   . Hypertension Father   . Hypertension Brother   . Cancer Maternal Uncle  65       colon  . Heart disease Paternal Grandfather   . Thyroid disease Mother     Past medical history, surgical history, medications, allergies, family history and social history reviewed with patient today and changes made to appropriate areas of the chart.   Review of Systems  Eyes: Negative for blurred vision and double vision.  Respiratory: Negative for shortness of breath.   Cardiovascular: Negative for chest pain, palpitations and leg swelling.  Neurological: Negative for dizziness and headaches.   All other ROS negative except what is listed above and in the HPI.      Objective:    BP 122/74   Pulse 67   Temp 98 F (36.7 C)   Ht 5' 9.8" (1.773 m)   Wt 220 lb 4 oz (99.9 kg)   SpO2 96%   BMI 31.78 kg/m   Wt Readings from Last 3 Encounters:  07/06/20 220 lb 4 oz (99.9 kg)  03/01/20 218 lb 12.8 oz (99.2 kg)  06/24/19 211 lb (95.7 kg)    Physical Exam Vitals  and nursing note reviewed.  Constitutional:      General: He is not in acute distress.    Appearance: Normal appearance. He is not ill-appearing, toxic-appearing or diaphoretic.  HENT:     Head: Normocephalic.     Right Ear: Tympanic membrane, ear canal and external ear normal.     Left Ear: Tympanic membrane, ear canal and external ear normal.     Nose: Nose normal. No congestion or rhinorrhea.     Mouth/Throat:     Mouth: Mucous membranes are moist.  Eyes:     General:        Right eye: No discharge.        Left eye: No discharge.     Extraocular Movements: Extraocular movements intact.     Conjunctiva/sclera: Conjunctivae normal.     Pupils: Pupils are equal, round, and reactive to light.  Cardiovascular:     Rate and Rhythm: Normal rate and regular rhythm.     Heart sounds: No murmur heard.   Pulmonary:     Effort: Pulmonary effort is normal. No respiratory distress.     Breath sounds: Normal breath sounds. No wheezing, rhonchi or rales.  Abdominal:     General: Abdomen is flat. Bowel sounds are normal. There is no distension.     Palpations: Abdomen is soft.     Tenderness: There is no abdominal tenderness. There is no guarding.  Musculoskeletal:     Cervical back: Normal range of motion and neck supple.  Skin:    General: Skin is warm and dry.     Capillary Refill: Capillary refill takes less than 2 seconds.  Neurological:     General: No focal deficit present.     Mental Status: He is alert and oriented to person, place, and time.     Cranial Nerves: No cranial nerve deficit.     Motor: No weakness.     Deep Tendon Reflexes: Reflexes normal.  Psychiatric:        Mood and Affect: Mood normal.        Behavior: Behavior normal.        Thought Content: Thought content normal.        Judgment: Judgment normal.     Results for orders placed or performed in visit on 06/24/19  Microscopic Examination   URINE  Result Value Ref Range   WBC, UA None seen 0 -  5 /hpf    RBC 0-2 0 - 2 /hpf   Epithelial Cells (non renal) 0-10 0 - 10 /hpf   Bacteria, UA None seen None seen/Few  CBC with Differential/Platelet  Result Value Ref Range   WBC 4.6 3.4 - 10.8 x10E3/uL   RBC 5.38 4.14 - 5.80 x10E6/uL   Hemoglobin 15.9 13.0 - 17.7 g/dL   Hematocrit 81.1 57.2 - 51.0 %   MCV 90 79 - 97 fL   MCH 29.6 26.6 - 33.0 pg   MCHC 32.9 31.5 - 35.7 g/dL   RDW 62.0 35.5 - 97.4 %   Platelets 201 150 - 450 x10E3/uL   Neutrophils 46 Not Estab. %   Lymphs 39 Not Estab. %   Monocytes 9 Not Estab. %   Eos 5 Not Estab. %   Basos 1 Not Estab. %   Neutrophils Absolute 2.1 1.4 - 7.0 x10E3/uL   Lymphocytes Absolute 1.8 0.7 - 3.1 x10E3/uL   Monocytes Absolute 0.4 0.1 - 0.9 x10E3/uL   EOS (ABSOLUTE) 0.2 0.0 - 0.4 x10E3/uL   Basophils Absolute 0.1 0.0 - 0.2 x10E3/uL   Immature Granulocytes 0 Not Estab. %   Immature Grans (Abs) 0.0 0.0 - 0.1 x10E3/uL  Comprehensive metabolic panel  Result Value Ref Range   Glucose 91 65 - 99 mg/dL   BUN 14 6 - 20 mg/dL   Creatinine, Ser 1.63 0.76 - 1.27 mg/dL   GFR calc non Af Amer 75 >59 mL/min/1.73   GFR calc Af Amer 87 >59 mL/min/1.73   BUN/Creatinine Ratio 11 9 - 20   Sodium 138 134 - 144 mmol/L   Potassium 4.2 3.5 - 5.2 mmol/L   Chloride 99 96 - 106 mmol/L   CO2 23 20 - 29 mmol/L   Calcium 9.3 8.7 - 10.2 mg/dL   Total Protein 7.3 6.0 - 8.5 g/dL   Albumin 4.5 4.0 - 5.0 g/dL   Globulin, Total 2.8 1.5 - 4.5 g/dL   Albumin/Globulin Ratio 1.6 1.2 - 2.2   Bilirubin Total 0.4 0.0 - 1.2 mg/dL   Alkaline Phosphatase 73 39 - 117 IU/L   AST 23 0 - 40 IU/L   ALT 16 0 - 44 IU/L  Lipid Panel w/o Chol/HDL Ratio  Result Value Ref Range   Cholesterol, Total 150 100 - 199 mg/dL   Triglycerides 69 0 - 149 mg/dL   HDL 63 >84 mg/dL   VLDL Cholesterol Cal 14 5 - 40 mg/dL   LDL Chol Calc (NIH) 73 0 - 99 mg/dL  TSH  Result Value Ref Range   TSH 0.870 0.450 - 4.500 uIU/mL  UA/M w/rflx Culture, Routine   Specimen: Urine   URINE  Result Value Ref  Range   Specific Gravity, UA 1.025 1.005 - 1.030   pH, UA 5.5 5.0 - 7.5   Color, UA Yellow Yellow   Appearance Ur Clear Clear   Leukocytes,UA Negative Negative   Protein,UA Negative Negative/Trace   Glucose, UA Negative Negative   Ketones, UA Trace (A) Negative   RBC, UA Trace (A) Negative   Bilirubin, UA Negative Negative   Urobilinogen, Ur 0.2 0.2 - 1.0 mg/dL   Nitrite, UA Negative Negative   Microscopic Examination See below:       Assessment & Plan:   Problem List Items Addressed This Visit      Other   Annual physical exam - Primary    Health Maintenance reviewed today.  Discussed Tetanus and HPV vaccines with patient during visit.  He declined both. Labs ordered today.       Relevant Orders   TSH   Lipid panel   CBC with Differential/Platelet   Comprehensive metabolic panel   Urinalysis, Routine w reflex microscopic    Other Visit Diagnoses    Encounter for hepatitis C screening test for low risk patient       Relevant Orders   Hepatitis C Antibody   Screening for HIV (human immunodeficiency virus)       Relevant Orders   HIV Antibody (routine testing w rflx)       Discussed aspirin prophylaxis for myocardial infarction prevention and decision was it was not indicated  LABORATORY TESTING:  Health maintenance labs ordered today as discussed above.     IMMUNIZATIONS:   - Tdap: Tetanus vaccination status reviewed: declined. - Influenza: Postponed to flu season - Pneumovax: Not applicable - Prevnar: Not applicable - HPV: Refused - Zostavax vaccine: Not applicable  SCREENING: - Colonoscopy: Not applicable  Discussed with patient purpose of the colonoscopy is to detect colon cancer at curable precancerous or early stages   - AAA Screening: Not applicable  -Hearing Test: Not applicable  -Spirometry: Not applicable   PATIENT COUNSELING:    Sexuality: Discussed sexually transmitted diseases, partner selection, use of condoms, avoidance of unintended  pregnancy  and contraceptive alternatives.   Advised to avoid cigarette smoking.  I discussed with the patient that most people either abstain from alcohol or drink within safe limits (<=14/week and <=4 drinks/occasion for males, <=7/weeks and <= 3 drinks/occasion for females) and that the risk for alcohol disorders and other health effects rises proportionally with the number of drinks per week and how often a drinker exceeds daily limits.  Discussed cessation/primary prevention of drug use and availability of treatment for abuse.   Diet: Encouraged to adjust caloric intake to maintain  or achieve ideal body weight, to reduce intake of dietary saturated fat and total fat, to limit sodium intake by avoiding high sodium foods and not adding table salt, and to maintain adequate dietary potassium and calcium preferably from fresh fruits, vegetables, and low-fat dairy products.    stressed the importance of regular exercise  Injury prevention: Discussed safety belts, safety helmets, smoke detector, smoking near bedding or upholstery.   Dental health: Discussed importance of regular tooth brushing, flossing, and dental visits.   Follow up plan: NEXT PREVENTATIVE PHYSICAL DUE IN 1 YEAR. Return in about 1 year (around 07/06/2021) for Physical and Fasting labs.

## 2020-07-06 NOTE — Assessment & Plan Note (Signed)
Health Maintenance reviewed today.  Discussed Tetanus and HPV vaccines with patient during visit.  He declined both. Labs ordered today.

## 2020-07-06 NOTE — Progress Notes (Signed)
Hi Tarry.  Your urine test this morning was normal. I will send you another message once the rest of your lab work comes back.

## 2020-07-07 LAB — COMPREHENSIVE METABOLIC PANEL
ALT: 17 IU/L (ref 0–44)
AST: 20 IU/L (ref 0–40)
Albumin/Globulin Ratio: 1.8 (ref 1.2–2.2)
Albumin: 4.6 g/dL (ref 4.0–5.0)
Alkaline Phosphatase: 77 IU/L (ref 44–121)
BUN/Creatinine Ratio: 9 (ref 9–20)
BUN: 12 mg/dL (ref 6–20)
Bilirubin Total: 0.3 mg/dL (ref 0.0–1.2)
CO2: 25 mmol/L (ref 20–29)
Calcium: 9.8 mg/dL (ref 8.7–10.2)
Chloride: 104 mmol/L (ref 96–106)
Creatinine, Ser: 1.29 mg/dL — ABNORMAL HIGH (ref 0.76–1.27)
Globulin, Total: 2.5 g/dL (ref 1.5–4.5)
Glucose: 99 mg/dL (ref 65–99)
Potassium: 4.5 mmol/L (ref 3.5–5.2)
Sodium: 144 mmol/L (ref 134–144)
Total Protein: 7.1 g/dL (ref 6.0–8.5)
eGFR: 74 mL/min/{1.73_m2} (ref >59)

## 2020-07-07 LAB — CBC WITH DIFFERENTIAL/PLATELET
Basophils Absolute: 0.1 10*3/uL (ref 0.0–0.2)
Basos: 1 %
EOS (ABSOLUTE): 0.3 10*3/uL (ref 0.0–0.4)
Eos: 6 %
Hematocrit: 48.4 % (ref 37.5–51.0)
Hemoglobin: 15.7 g/dL (ref 13.0–17.7)
Immature Grans (Abs): 0 10*3/uL (ref 0.0–0.1)
Immature Granulocytes: 0 %
Lymphocytes Absolute: 2 10*3/uL (ref 0.7–3.1)
Lymphs: 43 %
MCH: 28.9 pg (ref 26.6–33.0)
MCHC: 32.4 g/dL (ref 31.5–35.7)
MCV: 89 fL (ref 79–97)
Monocytes Absolute: 0.4 10*3/uL (ref 0.1–0.9)
Monocytes: 8 %
Neutrophils Absolute: 1.9 10*3/uL (ref 1.4–7.0)
Neutrophils: 42 %
Platelets: 204 10*3/uL (ref 150–450)
RBC: 5.44 x10E6/uL (ref 4.14–5.80)
RDW: 13.2 % (ref 11.6–15.4)
WBC: 4.6 10*3/uL (ref 3.4–10.8)

## 2020-07-07 LAB — TSH: TSH: 1.26 u[IU]/mL (ref 0.450–4.500)

## 2020-07-07 LAB — LIPID PANEL
Chol/HDL Ratio: 2.6 ratio (ref 0.0–5.0)
Cholesterol, Total: 189 mg/dL (ref 100–199)
HDL: 73 mg/dL (ref >39)
LDL Chol Calc (NIH): 89 mg/dL (ref 0–99)
Triglycerides: 160 mg/dL — ABNORMAL HIGH (ref 0–149)
VLDL Cholesterol Cal: 27 mg/dL (ref 5–40)

## 2020-07-07 LAB — HIV ANTIBODY (ROUTINE TESTING W REFLEX): HIV Screen 4th Generation wRfx: NONREACTIVE

## 2020-07-07 LAB — HEPATITIS C ANTIBODY: Hep C Virus Ab: <0.1 s/co ratio (ref 0.0–0.9)

## 2020-07-07 NOTE — Progress Notes (Signed)
Hi Greg Bradley.  Your lab work from your visit yesterday came back and looks good. Your thyroid, liver, kidneys, electrolytes, HIV, Hep C, and Complete blood count are normal. Your Triglycerides came back elevated.  I recommend you decrease the processed foods and follow a low fat diet.  Otherwise, your cholesterol looks good.  We will repeat it at your next visit.  Please let me know if you have any questions.

## 2021-07-11 NOTE — Progress Notes (Signed)
? ?BP 124/78   Pulse 62   Temp 98.2 ?F (36.8 ?C) (Oral)   Ht 5' 9.69" (1.77 m)   Wt 219 lb 6.4 oz (99.5 kg)   SpO2 96%   BMI 31.77 kg/m?   ? ?Subjective:  ? ? Patient ID: Greg Bradley, male    DOB: 1985/06/10, 36 y.o.   MRN: [id number] ? ?HPI: ?Greg Bradley is a 36 y.o. male presenting on 07/12/2021 for comprehensive medical examination. Current medical complaints include:none ? ?He currently lives with: ?Interim Problems from his last visit: no ? ? Denies HA, CP, SOB, dizziness, palpitations, visual changes, and lower extremity swelling. ? ?Depression Screen done today and results listed below:  ? ?  07/12/2021  ?  8:21 AM 03/01/2020  ?  9:46 AM 06/24/2019  ?  9:06 AM  ?Depression screen PHQ 2/9  ?Decreased Interest 0 0 0  ?Down, Depressed, Hopeless 0 0 0  ?PHQ - 2 Score 0 0 0  ?Altered sleeping 0  0  ?Tired, decreased energy 0  0  ?Change in appetite 0  0  ?Feeling bad or failure about yourself  0  0  ?Trouble concentrating 0  0  ?Moving slowly or fidgety/restless 0  0  ?Suicidal thoughts 0  0  ?PHQ-9 Score 0  0  ?Difficult doing work/chores Not difficult at all    ? ? ?The patient does not have a history of falls. I did complete a risk assessment for falls. A plan of care for falls was documented. ? ? ?Past Medical History:  ?Past Medical History:  ?Diagnosis Date  ? Allergy   ? Clavicular fracture   ? left, football injury  ? COVID 10/2019  ? ? ?Surgical History:  ?Past Surgical History:  ?Procedure Laterality Date  ? NO PAST SURGERIES    ? ? ?Medications:  ?Current Outpatient Medications on File Prior to Visit  ?Medication Sig  ? loratadine (CLARITIN) 10 MG tablet Take 10 mg by mouth daily.  ? ?No current facility-administered medications on file prior to visit.  ? ? ?Allergies:  ?No Known Allergies ? ?Social History:  ?Social History  ? ?Socioeconomic History  ? Marital status: Married  ?  Spouse name: Not on file  ? Number of children: Not on file  ? Years of education: Not on file  ? Highest education  level: Not on file  ?Occupational History  ? Not on file  ?Tobacco Use  ? Smoking status: Never  ? Smokeless tobacco: Never  ?Vaping Use  ? Vaping Use: Never used  ?Substance and Sexual Activity  ? Alcohol use: No  ? Drug use: No  ? Sexual activity: Yes  ?Other Topics Concern  ? Not on file  ?Social History Narrative  ? Lives with wife and 2 children in [place]. No pets  ?   ? Work - Higher education careers adviser  ?   ? School - online out of [place] [place]  ?   ? Diet - regular  ?   ? Exercise - 3 days per week, [employer]  ? ?Social Determinants of Health  ? ?Financial Resource Strain: Not on file  ?Food Insecurity: Not on file  ?Transportation Needs: Not on file  ?Physical Activity: Not on file  ?Stress: Not on file  ?Social Connections: Not on file  ?Intimate Partner Violence: Not on file  ? ?Social History  ? ?Tobacco Use  ?Smoking Status Never  ?Smokeless Tobacco Never  ? ?Social History  ? ?Substance and Sexual Activity  ?  Alcohol Use No  ? ? ?Family History:  ?Family History  ?Problem Relation Age of Onset  ? Hypertension Paternal Grandmother   ? Hypertension Father   ? Hypertension Brother   ? Cancer Maternal Uncle 82  ?     colon  ? Heart disease Paternal Grandfather   ? Thyroid disease Mother   ? ? ?Past medical history, surgical history, medications, allergies, family history and social history reviewed with patient today and changes made to appropriate areas of the chart.  ? ?Review of Systems  ?Eyes:  Negative for blurred vision and double vision.  ?Respiratory:  Negative for shortness of breath.   ?Cardiovascular:  Negative for chest pain, palpitations and leg swelling.  ?Neurological:  Negative for dizziness and headaches.  ?All other ROS negative except what is listed above and in the HPI.  ? ?   ?Objective:  ?  ?BP 124/78   Pulse 62   Temp 98.2 ?F (36.8 ?C) (Oral)   Ht 5' 9.69" (1.77 m)   Wt 219 lb 6.4 oz (99.5 kg)   SpO2 96%   BMI 31.77 kg/m?   ?Wt Readings from Last 3 Encounters:  ?07/12/21 219 lb 6.4 oz  (99.5 kg)  ?07/06/20 220 lb 4 oz (99.9 kg)  ?03/01/20 218 lb 12.8 oz (99.2 kg)  ?  ?Physical Exam ?Vitals and nursing note reviewed.  ?Constitutional:   ?   General: He is not in acute distress. ?   Appearance: Normal appearance. He is normal weight. He is not ill-appearing, toxic-appearing or diaphoretic.  ?HENT:  ?   Head: Normocephalic.  ?   Right Ear: Tympanic membrane, ear canal and external ear normal.  ?   Left Ear: Tympanic membrane, ear canal and external ear normal.  ?   Nose: Nose normal. No congestion or rhinorrhea.  ?   Mouth/Throat:  ?   Mouth: Mucous membranes are moist.  ?Eyes:  ?   General:     ?   Right eye: No discharge.     ?   Left eye: No discharge.  ?   Extraocular Movements: Extraocular movements intact.  ?   Conjunctiva/sclera: Conjunctivae normal.  ?   Pupils: Pupils are equal, round, and reactive to light.  ?Cardiovascular:  ?   Rate and Rhythm: Normal rate and regular rhythm.  ?   Heart sounds: No murmur heard. ?Pulmonary:  ?   Effort: Pulmonary effort is normal. No respiratory distress.  ?   Breath sounds: Normal breath sounds. No wheezing, rhonchi or rales.  ?Abdominal:  ?   General: Abdomen is flat. Bowel sounds are normal. There is no distension.  ?   Palpations: Abdomen is soft.  ?   Tenderness: There is no abdominal tenderness. There is no guarding.  ?Musculoskeletal:  ?   Cervical back: Normal range of motion and neck supple.  ?Skin: ?   General: Skin is warm and dry.  ?   Capillary Refill: Capillary refill takes less than 2 seconds.  ?Neurological:  ?   General: No focal deficit present.  ?   Mental Status: He is alert and oriented to person, place, and time.  ?   Cranial Nerves: No cranial nerve deficit.  ?   Motor: No weakness.  ?   Deep Tendon Reflexes: Reflexes normal.  ?Psychiatric:     ?   Mood and Affect: Mood normal.     ?   Behavior: Behavior normal.     ?   Thought Content:  Thought content normal.     ?   Judgment: Judgment normal.  ? ? ?Results for orders placed or  performed in visit on 07/06/20  ?TSH  ?Result Value Ref Range  ? TSH 1.260 0.450 - 4.500 uIU/mL  ?Lipid panel  ?Result Value Ref Range  ? Cholesterol, Total 189 100 - 199 mg/dL  ? Triglycerides 160 (H) 0 - 149 mg/dL  ? HDL 73 >39 mg/dL  ? VLDL Cholesterol Cal 27 5 - 40 mg/dL  ? LDL Chol Calc (NIH) 89 0 - 99 mg/dL  ? Chol/HDL Ratio 2.6 0.0 - 5.0 ratio  ?CBC with Differential/Platelet  ?Result Value Ref Range  ? WBC 4.6 3.4 - 10.8 x10E3/uL  ? RBC 5.44 4.14 - 5.80 x10E6/uL  ? Hemoglobin 15.7 13.0 - 17.7 g/dL  ? Hematocrit 48.4 37.5 - 51.0 %  ? MCV 89 79 - 97 fL  ? MCH 28.9 26.6 - 33.0 pg  ? MCHC 32.4 31.5 - 35.7 g/dL  ? RDW 13.2 11.6 - 15.4 %  ? Platelets 204 150 - 450 x10E3/uL  ? Neutrophils 42 Not Estab. %  ? Lymphs 43 Not Estab. %  ? Monocytes 8 Not Estab. %  ? Eos 6 Not Estab. %  ? Basos 1 Not Estab. %  ? Neutrophils Absolute 1.9 1.4 - 7.0 x10E3/uL  ? Lymphocytes Absolute 2.0 0.7 - 3.1 x10E3/uL  ? Monocytes Absolute 0.4 0.1 - 0.9 x10E3/uL  ? EOS (ABSOLUTE) 0.3 0.0 - 0.4 x10E3/uL  ? Basophils Absolute 0.1 0.0 - 0.2 x10E3/uL  ? Immature Granulocytes 0 Not Estab. %  ? Immature Grans (Abs) 0.0 0.0 - 0.1 x10E3/uL  ?Comprehensive metabolic panel  ?Result Value Ref Range  ? Glucose 99 65 - 99 mg/dL  ? BUN 12 6 - 20 mg/dL  ? Creatinine, Ser 1.29 (H) 0.76 - 1.27 mg/dL  ? eGFR 74 >59 mL/min/1.73  ? BUN/Creatinine Ratio 9 9 - 20  ? Sodium 144 134 - 144 mmol/L  ? Potassium 4.5 3.5 - 5.2 mmol/L  ? Chloride 104 96 - 106 mmol/L  ? CO2 25 20 - 29 mmol/L  ? Calcium 9.8 8.7 - 10.2 mg/dL  ? Total Protein 7.1 6.0 - 8.5 g/dL  ? Albumin 4.6 4.0 - 5.0 g/dL  ? Globulin, Total 2.5 1.5 - 4.5 g/dL  ? Albumin/Globulin Ratio 1.8 1.2 - 2.2  ? Bilirubin Total 0.3 0.0 - 1.2 mg/dL  ? Alkaline Phosphatase 77 44 - 121 IU/L  ? AST 20 0 - 40 IU/L  ? ALT 17 0 - 44 IU/L  ?Urinalysis, Routine w reflex microscopic  ?Result Value Ref Range  ? Specific Gravity, UA 1.020 1.005 - 1.030  ? pH, UA 6.0 5.0 - 7.5  ? Color, UA Yellow Yellow  ? Appearance Ur Clear  Clear  ? Leukocytes,UA Negative Negative  ? Protein,UA Negative Negative/Trace  ? Glucose, UA Negative Negative  ? Ketones, UA Negative Negative  ? RBC, UA Negative Negative  ? Bilirubin, UA Negative Negative  ? Urobilin

## 2021-07-12 ENCOUNTER — Ambulatory Visit (INDEPENDENT_AMBULATORY_CARE_PROVIDER_SITE_OTHER): Payer: No Typology Code available for payment source | Admitting: Nurse Practitioner

## 2021-07-12 ENCOUNTER — Encounter: Payer: Self-pay | Admitting: Nurse Practitioner

## 2021-07-12 VITALS — BP 124/78 | HR 62 | Temp 98.2°F | Ht 69.69 in | Wt 219.4 lb

## 2021-07-12 DIAGNOSIS — Z136 Encounter for screening for cardiovascular disorders: Secondary | ICD-10-CM

## 2021-07-12 DIAGNOSIS — Z0000 Encounter for general adult medical examination without abnormal findings: Secondary | ICD-10-CM

## 2021-07-12 DIAGNOSIS — Z23 Encounter for immunization: Secondary | ICD-10-CM | POA: Diagnosis not present

## 2021-07-12 LAB — URINALYSIS, ROUTINE W REFLEX MICROSCOPIC
Bilirubin, UA: NEGATIVE
Glucose, UA: NEGATIVE
Ketones, UA: NEGATIVE
Leukocytes,UA: NEGATIVE
Nitrite, UA: NEGATIVE
Protein,UA: NEGATIVE
RBC, UA: NEGATIVE
Specific Gravity, UA: >1.03 — ABNORMAL HIGH (ref 1.005–1.030)
Urobilinogen, Ur: 1 mg/dL (ref 0.2–1.0)
pH, UA: 6 (ref 5.0–7.5)

## 2021-07-13 LAB — COMPREHENSIVE METABOLIC PANEL
ALT: 20 IU/L (ref 0–44)
AST: 22 IU/L (ref 0–40)
Albumin/Globulin Ratio: 1.6 (ref 1.2–2.2)
Albumin: 4.5 g/dL (ref 4.0–5.0)
Alkaline Phosphatase: 68 IU/L (ref 44–121)
BUN/Creatinine Ratio: 10 (ref 9–20)
BUN: 13 mg/dL (ref 6–20)
Bilirubin Total: 0.5 mg/dL (ref 0.0–1.2)
CO2: 23 mmol/L (ref 20–29)
Calcium: 9.6 mg/dL (ref 8.7–10.2)
Chloride: 102 mmol/L (ref 96–106)
Creatinine, Ser: 1.29 mg/dL — ABNORMAL HIGH (ref 0.76–1.27)
Globulin, Total: 2.8 g/dL (ref 1.5–4.5)
Glucose: 95 mg/dL (ref 70–99)
Potassium: 4.5 mmol/L (ref 3.5–5.2)
Sodium: 138 mmol/L (ref 134–144)
Total Protein: 7.3 g/dL (ref 6.0–8.5)
eGFR: 74 mL/min/{1.73_m2} (ref >59)

## 2021-07-13 LAB — CBC WITH DIFFERENTIAL/PLATELET
Basophils Absolute: 0.1 10*3/uL (ref 0.0–0.2)
Basos: 2 %
EOS (ABSOLUTE): 0.2 10*3/uL (ref 0.0–0.4)
Eos: 5 %
Hematocrit: 46.7 % (ref 37.5–51.0)
Hemoglobin: 15.6 g/dL (ref 13.0–17.7)
Immature Grans (Abs): 0 10*3/uL (ref 0.0–0.1)
Immature Granulocytes: 0 %
Lymphocytes Absolute: 1.8 10*3/uL (ref 0.7–3.1)
Lymphs: 45 %
MCH: 29.1 pg (ref 26.6–33.0)
MCHC: 33.4 g/dL (ref 31.5–35.7)
MCV: 87 fL (ref 79–97)
Monocytes Absolute: 0.4 10*3/uL (ref 0.1–0.9)
Monocytes: 10 %
Neutrophils Absolute: 1.5 10*3/uL (ref 1.4–7.0)
Neutrophils: 38 %
Platelets: 210 10*3/uL (ref 150–450)
RBC: 5.36 x10E6/uL (ref 4.14–5.80)
RDW: 12.8 % (ref 11.6–15.4)
WBC: 4 10*3/uL (ref 3.4–10.8)

## 2021-07-13 LAB — LIPID PANEL
Chol/HDL Ratio: 2.7 ratio (ref 0.0–5.0)
Cholesterol, Total: 179 mg/dL (ref 100–199)
HDL: 67 mg/dL (ref >39)
LDL Chol Calc (NIH): 102 mg/dL — ABNORMAL HIGH (ref 0–99)
Triglycerides: 51 mg/dL (ref 0–149)
VLDL Cholesterol Cal: 10 mg/dL (ref 5–40)

## 2021-07-13 LAB — TSH: TSH: 1.25 u[IU]/mL (ref 0.450–4.500)

## 2021-07-13 NOTE — Progress Notes (Signed)
HI Greg Bradley. It was nice to see you yesterday.  Your lab work looks good.  Your cholesterol is slightly elevated.  Your kidneys look like you were a little dehydrated.  Make sure you drink plenty of water.  Continue with your current medication regimen.  Follow up as discussed.  Please let me know if you have any questions.

## 2022-07-27 NOTE — Progress Notes (Unsigned)
There were no vitals taken for this visit.   Subjective:    Patient ID: Greg Bradley, male    DOB: [birth year], 37 y.o.   MRN: [id number]  HPI: Greg Bradley is a 37 y.o. male presenting on 07/30/2022 for comprehensive medical examination. Current medical complaints include:none  He currently lives with: Interim Problems from his last visit: no   Denies HA, CP, SOB, dizziness, palpitations, visual changes, and lower extremity swelling.  Depression Screen done today and results listed below:     07/12/2021    8:21 AM 03/01/2020    9:46 AM 06/24/2019    9:06 AM  Depression screen PHQ 2/9  Decreased Interest 0 0 0  Down, Depressed, Hopeless 0 0 0  PHQ - 2 Score 0 0 0  Altered sleeping 0  0  Tired, decreased energy 0  0  Change in appetite 0  0  Feeling bad or failure about yourself  0  0  Trouble concentrating 0  0  Moving slowly or fidgety/restless 0  0  Suicidal thoughts 0  0  PHQ-9 Score 0  0  Difficult doing work/chores Not difficult at all      The patient does not have a history of falls. I did complete a risk assessment for falls. A plan of care for falls was documented.   Past Medical History:  Past Medical History:  Diagnosis Date  . Allergy   . Clavicular fracture    left, football injury  . COVID 10/2019    Surgical History:  Past Surgical History:  Procedure Laterality Date  . NO PAST SURGERIES      Medications:  Current Outpatient Medications on File Prior to Visit  Medication Sig  . loratadine (CLARITIN) 10 MG tablet Take 10 mg by mouth daily.   No current facility-administered medications on file prior to visit.    Allergies:  No Known Allergies  Social History:  Social History   Socioeconomic History  . Marital status: Married    Spouse name: Not on file  . Number of children: Not on file  . Years of education: Not on file  . Highest education level: Not on file  Occupational History  . Not on file  Tobacco Use  . Smoking status:  Never  . Smokeless tobacco: Never  Vaping Use  . Vaping Use: Never used  Substance and Sexual Activity  . Alcohol use: No  . Drug use: No  . Sexual activity: Yes  Other Topics Concern  . Not on file  Social History Narrative   Lives with wife and 2 children in [place]. 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[employer]      Diet - regular      Exercise - 3 days per week, [employer]   Social Determinants of Corporate investment banker Strain: Not on [employer] Insecurity: Not on file  Transportation Needs: Not on file  Physical Activity: Not on file  Stress: Not on file  Social Connections: Not on file  Intimate Partner Violence: Not on file   Social History   Tobacco Use  Smoking Status Never  Smokeless Tobacco Never   Social History   Substance and Sexual Activity  Alcohol Use No    Family History:  Family History  Problem Relation Age of Onset  . Hypertension Paternal Grandmother   . Hypertension Father   . Hypertension  Brother   . Cancer Maternal Uncle 65       colon  . Heart disease Paternal Grandfather   . Thyroid disease Mother     Past medical history, surgical history, medications, allergies, family history and social history reviewed with patient today and changes made to appropriate areas of the chart.   Review of Systems  Eyes:  Negative for blurred vision and double vision.  Respiratory:  Negative for shortness of breath.   Cardiovascular:  Negative for chest pain, palpitations and leg swelling.  Neurological:  Negative for dizziness and headaches.  All other ROS negative except what is listed above and in the HPI.      Objective:    There were no vitals taken for this visit.  Wt Readings from Last 3 Encounters:  07/12/21 219 lb 6.4 oz (99.5 kg)  07/06/20 220 lb 4 oz (99.9 kg)  03/01/20 218 lb 12.8 oz (99.2 kg)    Physical Exam Vitals and nursing note reviewed.  Constitutional:      General: He is not  in acute distress.    Appearance: Normal appearance. He is normal weight. He is not ill-appearing, toxic-appearing or diaphoretic.  HENT:     Head: Normocephalic.     Right Ear: Tympanic membrane, ear canal and external ear normal.     Left Ear: Tympanic membrane, ear canal and external ear normal.     Nose: Nose normal. No congestion or rhinorrhea.     Mouth/Throat:     Mouth: Mucous membranes are moist.  Eyes:     General:        Right eye: No discharge.        Left eye: No discharge.     Extraocular Movements: Extraocular movements intact.     Conjunctiva/sclera: Conjunctivae normal.     Pupils: Pupils are equal, round, and reactive to light.  Cardiovascular:     Rate and Rhythm: Normal rate and regular rhythm.     Heart sounds: No murmur heard. Pulmonary:     Effort: Pulmonary effort is normal. No respiratory distress.     Breath sounds: Normal breath sounds. No wheezing, rhonchi or rales.  Abdominal:     General: Abdomen is flat. Bowel sounds are normal. There is no distension.     Palpations: Abdomen is soft.     Tenderness: There is no abdominal tenderness. There is no guarding.  Musculoskeletal:     Cervical back: Normal range of motion and neck supple.  Skin:    General: Skin is warm and dry.     Capillary Refill: Capillary refill takes less than 2 seconds.  Neurological:     General: No focal deficit present.     Mental Status: He is alert and oriented to person, place, and time.     Cranial Nerves: No cranial nerve deficit.     Motor: No weakness.     Deep Tendon Reflexes: Reflexes normal.  Psychiatric:        Mood and Affect: Mood normal.        Behavior: Behavior normal.        Thought Content: Thought content normal.        Judgment: Judgment normal.    Results for orders placed or performed in visit on 07/12/21  TSH  Result Value Ref Range   TSH 1.250 0.450 - 4.500 uIU/mL  Lipid panel  Result Value Ref Range   Cholesterol, Total 179 100 - 199 mg/dL    Triglycerides 51  0 - 149 mg/dL   HDL 67 >16 mg/dL   VLDL Cholesterol Cal 10 5 - 40 mg/dL   LDL Chol Calc (NIH) 109 (H) 0 - 99 mg/dL   Chol/HDL Ratio 2.7 0.0 - 5.0 ratio  CBC with Differential/Platelet  Result Value Ref Range   WBC 4.0 3.4 - 10.8 x10E3/uL   RBC 5.36 4.14 - 5.80 x10E6/uL   Hemoglobin 15.6 13.0 - 17.7 g/dL   Hematocrit 60.4 54.0 - 51.0 %   MCV 87 79 - 97 fL   MCH 29.1 26.6 - 33.0 pg   MCHC 33.4 31.5 - 35.7 g/dL   RDW 98.1 19.1 - 47.8 %   Platelets 210 150 - 450 x10E3/uL   Neutrophils 38 Not Estab. %   Lymphs 45 Not Estab. %   Monocytes 10 Not Estab. %   Eos 5 Not Estab. %   Basos 2 Not Estab. %   Neutrophils Absolute 1.5 1.4 - 7.0 x10E3/uL   Lymphocytes Absolute 1.8 0.7 - 3.1 x10E3/uL   Monocytes Absolute 0.4 0.1 - 0.9 x10E3/uL   EOS (ABSOLUTE) 0.2 0.0 - 0.4 x10E3/uL   Basophils Absolute 0.1 0.0 - 0.2 x10E3/uL   Immature Granulocytes 0 Not Estab. %   Immature Grans (Abs) 0.0 0.0 - 0.1 x10E3/uL  Comprehensive metabolic panel  Result Value Ref Range   Glucose 95 70 - 99 mg/dL   BUN 13 6 - 20 mg/dL   Creatinine, Ser 2.95 (H) 0.76 - 1.27 mg/dL   eGFR 74 >62 ZH/YQM/5.78   BUN/Creatinine Ratio 10 9 - 20   Sodium 138 134 - 144 mmol/L   Potassium 4.5 3.5 - 5.2 mmol/L   Chloride 102 96 - 106 mmol/L   CO2 23 20 - 29 mmol/L   Calcium 9.6 8.7 - 10.2 mg/dL   Total Protein 7.3 6.0 - 8.5 g/dL   Albumin 4.5 4.0 - 5.0 g/dL   Globulin, Total 2.8 1.5 - 4.5 g/dL   Albumin/Globulin Ratio 1.6 1.2 - 2.2   Bilirubin Total 0.5 0.0 - 1.2 mg/dL   Alkaline Phosphatase 68 44 - 121 IU/L   AST 22 0 - 40 IU/L   ALT 20 0 - 44 IU/L  Urinalysis, Routine w reflex microscopic  Result Value Ref Range   Specific Gravity, UA >1.030 (H) 1.005 - 1.030   pH, UA 6.0 5.0 - 7.5   Color, UA Yellow Yellow   Appearance Ur Clear Clear   Leukocytes,UA Negative Negative   Protein,UA Negative Negative/Trace   Glucose, UA Negative Negative   Ketones, UA Negative Negative   RBC, UA Negative Negative    Bilirubin, UA Negative Negative   Urobilinogen, Ur 1.0 0.2 - 1.0 mg/dL   Nitrite, UA Negative Negative      Assessment & Plan:   Problem List Items Addressed This Visit   None    Discussed aspirin prophylaxis for myocardial infarction prevention and decision was it was not indicated  LABORATORY TESTING:  Health maintenance labs ordered today as discussed above.    IMMUNIZATIONS:   - Tdap: Tetanus vaccination status reviewed: Given today. - Influenza: Postponed to flu season - Pneumovax: Not applicable - Prevnar: Not applicable - COVID: Not applicable - HPV: Not applicable - Shingrix vaccine: Not applicable  SCREENING: - Colonoscopy: Not applicable  Discussed with patient purpose of the colonoscopy is to detect colon cancer at curable precancerous or early stages   - AAA Screening: Not applicable  -Hearing Test: Not applicable  -Spirometry: Not applicable  PATIENT COUNSELING:    Sexuality: Discussed sexually transmitted diseases, partner selection, use of condoms, avoidance of unintended pregnancy  and contraceptive alternatives.   Advised to avoid cigarette smoking.  I discussed with the patient that most people either abstain from alcohol or drink within safe limits (<=14/week and <=4 drinks/occasion for males, <=7/weeks and <= 3 drinks/occasion for females) and that the risk for alcohol disorders and other health effects rises proportionally with the number of drinks per week and how often a drinker exceeds daily limits.  Discussed cessation/primary prevention of drug use and availability of treatment for abuse.   Diet: Encouraged to adjust caloric intake to maintain  or achieve ideal body weight, to reduce intake of dietary saturated fat and total fat, to limit sodium intake by avoiding high sodium foods and not adding table salt, and to maintain adequate dietary potassium and calcium preferably from fresh fruits, vegetables, and low-fat dairy products.     stressed the importance of regular exercise  Injury prevention: Discussed safety belts, safety helmets, smoke detector, smoking near bedding or upholstery.   Dental health: Discussed importance of regular tooth brushing, flossing, and dental visits.   Follow up plan: NEXT PREVENTATIVE PHYSICAL DUE IN 1 YEAR. No follow-ups on file.

## 2022-07-30 ENCOUNTER — Ambulatory Visit (INDEPENDENT_AMBULATORY_CARE_PROVIDER_SITE_OTHER): Payer: No Typology Code available for payment source | Admitting: Nurse Practitioner

## 2022-07-30 ENCOUNTER — Telehealth: Payer: Self-pay | Admitting: Nurse Practitioner

## 2022-07-30 ENCOUNTER — Encounter: Payer: Self-pay | Admitting: Nurse Practitioner

## 2022-07-30 VITALS — BP 120/85 | HR 70 | Temp 98.0°F | Ht 69.69 in | Wt 227.0 lb

## 2022-07-30 DIAGNOSIS — S0300XA Dislocation of jaw, unspecified side, initial encounter: Secondary | ICD-10-CM

## 2022-07-30 DIAGNOSIS — Z136 Encounter for screening for cardiovascular disorders: Secondary | ICD-10-CM | POA: Diagnosis not present

## 2022-07-30 DIAGNOSIS — Z0000 Encounter for general adult medical examination without abnormal findings: Secondary | ICD-10-CM

## 2022-07-30 DIAGNOSIS — J358 Other chronic diseases of tonsils and adenoids: Secondary | ICD-10-CM

## 2022-07-30 LAB — URINALYSIS, ROUTINE W REFLEX MICROSCOPIC
Bilirubin, UA: NEGATIVE
Glucose, UA: NEGATIVE
Ketones, UA: NEGATIVE
Leukocytes,UA: NEGATIVE
Nitrite, UA: NEGATIVE
Protein,UA: NEGATIVE
RBC, UA: NEGATIVE
Specific Gravity, UA: 1.015 (ref 1.005–1.030)
Urobilinogen, Ur: 0.2 mg/dL (ref 0.2–1.0)
pH, UA: 7 (ref 5.0–7.5)

## 2022-07-30 MED ORDER — METHYLPREDNISOLONE 4 MG PO TBPK: ORAL_TABLET | ORAL | 0 refills | Status: AC

## 2022-07-30 NOTE — Telephone Encounter (Unsigned)
Copied from CRM [id number]. Topic: General - Inquiry >> Jul 30, 2022  1:14 PM De Blanch wrote: Reason for E[id number] calling in regards to the referral that was sent to them. Requesting to confirm what patient needs to be seen for. 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  May need a referral to an orthodontist.  Please advise.

## 2022-07-30 NOTE — Telephone Encounter (Signed)
Routing to provider to advise.  

## 2022-07-30 NOTE — Assessment & Plan Note (Signed)
Health maintenance reviewed during visit today.  Labs ordered.  Vaccines up to date.

## 2022-07-30 NOTE — Telephone Encounter (Signed)
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

## 2022-07-31 ENCOUNTER — Encounter: Payer: Self-pay | Admitting: Nurse Practitioner

## 2022-07-31 LAB — CBC WITH DIFFERENTIAL/PLATELET
Basophils Absolute: 0 10*3/uL (ref 0.0–0.2)
Basos: 1 %
EOS (ABSOLUTE): 0.2 10*3/uL (ref 0.0–0.4)
Eos: 5 %
Hematocrit: 48.6 % (ref 37.5–51.0)
Hemoglobin: 15.4 g/dL (ref 13.0–17.7)
Immature Grans (Abs): 0 10*3/uL (ref 0.0–0.1)
Immature Granulocytes: 1 %
Lymphocytes Absolute: 1.7 10*3/uL (ref 0.7–3.1)
Lymphs: 40 %
MCH: 28.6 pg (ref 26.6–33.0)
MCHC: 31.7 g/dL (ref 31.5–35.7)
MCV: 90 fL (ref 79–97)
Monocytes Absolute: 0.4 10*3/uL (ref 0.1–0.9)
Monocytes: 9 %
Neutrophils Absolute: 1.9 10*3/uL (ref 1.4–7.0)
Neutrophils: 44 %
Platelets: 188 10*3/uL (ref 150–450)
RBC: 5.38 x10E6/uL (ref 4.14–5.80)
RDW: 13.1 % (ref 11.6–15.4)
WBC: 4.3 10*3/uL (ref 3.4–10.8)

## 2022-07-31 LAB — COMPREHENSIVE METABOLIC PANEL
ALT: 23 IU/L (ref 0–44)
AST: 26 IU/L (ref 0–40)
Albumin/Globulin Ratio: 1.7 (ref 1.2–2.2)
Albumin: 4.3 g/dL (ref 4.1–5.1)
Alkaline Phosphatase: 76 IU/L (ref 44–121)
BUN/Creatinine Ratio: 10 (ref 9–20)
BUN: 12 mg/dL (ref 6–20)
Bilirubin Total: 0.5 mg/dL (ref 0.0–1.2)
CO2: 23 mmol/L (ref 20–29)
Calcium: 9.4 mg/dL (ref 8.7–10.2)
Chloride: 101 mmol/L (ref 96–106)
Creatinine, Ser: 1.21 mg/dL (ref 0.76–1.27)
Globulin, Total: 2.6 g/dL (ref 1.5–4.5)
Glucose: 104 mg/dL — ABNORMAL HIGH (ref 70–99)
Potassium: 4.5 mmol/L (ref 3.5–5.2)
Sodium: 138 mmol/L (ref 134–144)
Total Protein: 6.9 g/dL (ref 6.0–8.5)
eGFR: 79 mL/min/{1.73_m2} (ref >59)

## 2022-07-31 LAB — TSH: TSH: 0.962 u[IU]/mL (ref 0.450–4.500)

## 2022-07-31 LAB — LIPID PANEL
Chol/HDL Ratio: 2.3 ratio (ref 0.0–5.0)
Cholesterol, Total: 179 mg/dL (ref 100–199)
HDL: 79 mg/dL (ref >39)
LDL Chol Calc (NIH): 86 mg/dL (ref 0–99)
Triglycerides: 75 mg/dL (ref 0–149)
VLDL Cholesterol Cal: 14 mg/dL (ref 5–40)

## 2022-07-31 NOTE — Progress Notes (Signed)
Hi Greg Bradley. It was nice to see you yesterday.  Your lab work looks good.  No concerns at this time. Continue with your current medication regimen.  Follow up as discussed.  Please let me know if you have any questions.

## 2022-07-31 NOTE — Telephone Encounter (Signed)
Called and notified patient of Karen's message. Patient states he still would like to see ENT for accumulation of tonsil stones in the left tonsil. Can new referral be placed to ENT for this issue?

## 2022-07-31 NOTE — Telephone Encounter (Signed)
Referral placed.

## 2022-08-08 ENCOUNTER — Ambulatory Visit: Payer: No Typology Code available for payment source | Admitting: Nurse Practitioner

## 2023-07-31 ENCOUNTER — Encounter: Payer: Self-pay | Admitting: Nurse Practitioner

## 2023-07-31 NOTE — Progress Notes (Deleted)
 There were no vitals taken for this visit.   Subjective:    Patient ID: Greg Bradley, male    DOB: 1985/06/21, 38 y.o.   MRN: [id number]  HPI: Greg Bradley is a 38 y.o. male presenting on 07/31/2023 for comprehensive medical examination. Current medical complaints include:{Blank single:19197::"none","***"}  He currently lives with: Interim Problems from his last visit: {Blank single:19197::"yes","no"}  Depression Screen done today and results listed below:     07/30/2022    8:14 AM 07/12/2021    8:21 AM 03/01/2020    9:46 AM 06/24/2019    9:06 AM  Depression screen PHQ 2/9  Decreased Interest 0 0 0 0  Down, Depressed, Hopeless 0 0 0 0  PHQ - 2 Score 0 0 0 0  Altered sleeping 0 0  0  Tired, decreased energy 0 0  0  Change in appetite 0 0  0  Feeling bad or failure about yourself  0 0  0  Trouble concentrating 0 0  0  Moving slowly or fidgety/restless 0 0  0  Suicidal thoughts 0 0  0  PHQ-9 Score 0 0  0  Difficult doing work/chores Not difficult at all Not difficult at all      The patient {has/does not have:19849} a history of falls. I {did/did not:19850} complete a risk assessment for falls. A plan of care for falls {was/was not:19852} documented.   Past Medical History:  Past Medical History:  Diagnosis Date   Allergy    Clavicular fracture    left, football injury   COVID 10/2019    Surgical History:  Past Surgical History:  Procedure Laterality Date   NO PAST SURGERIES      Medications:  Current Outpatient Medications on File Prior to Visit  Medication Sig   loratadine (CLARITIN) 10 MG tablet Take 10 mg by mouth daily.   methylPREDNISolone  (MEDROL  DOSEPAK) 4 MG TBPK tablet Take as directed   No current facility-administered medications on file prior to visit.    Allergies:  No Known Allergies  Social History:  Social History   Socioeconomic History   Marital status: Married    Spouse name: Not on file   Number of children: Not on file   Years of  education: Not on file   Highest education level: Not on file  Occupational History   Not on file  Tobacco Use   Smoking status: Never   Smokeless tobacco: Never  Vaping Use   Vaping status: Never Used  Substance and Sexual Activity   Alcohol use: No   Drug use: No   Sexual activity: Yes  Other Topics Concern   Not on file  Social History Narrative   Lives with wife and 2 children in [place]. 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[employer]      Diet - regular      Exercise - 3 days per week, [employer]   Social Drivers of Corporate investment banker Strain: Not on [employer] Insecurity: Not on file  Transportation Needs: Not on file  Physical Activity: Not on file  Stress: Not on file  Social Connections: Unknown (12/28/2022)   Received from [facility]   Social Network    Social Network: Not on file  Intimate Partner Violence: Unknown (12/28/2022)   Received from [facility]   HITS    Physically Hurt: Not on file  Insult or Talk Down To: Not on file    Threaten Physical Harm: Not on file    Scream or Curse: Not on file   Social History   Tobacco Use  Smoking Status Never  Smokeless Tobacco Never   Social History   Substance and Sexual Activity  Alcohol Use No    Family History:  Family History  Problem Relation Age of Onset   Hypertension Paternal Grandmother    Hypertension Father    Hypertension Brother    Cancer Maternal Uncle 4       colon   Heart disease Paternal Grandfather    Thyroid disease Mother     Past medical history, surgical history, medications, allergies, family history and social history reviewed with patient today and changes made to appropriate areas of the chart.   ROS All other ROS negative except what is listed above and in the HPI.      Objective:     There were no vitals taken for this visit.  Wt Readings from Last 3 Encounters:  07/30/22 227 lb (103 kg)  07/12/21 219 lb 6.4  oz (99.5 kg)  07/06/20 220 lb 4 oz (99.9 kg)    Physical Exam  Results for orders placed or performed in visit on 07/30/22  Urinalysis, Routine w reflex microscopic   Collection Time: 07/30/22  8:24 AM  Result Value Ref Range   Specific Gravity, UA 1.015 1.005 - 1.030   pH, UA 7.0 5.0 - 7.5   Color, UA Yellow Yellow   Appearance Ur Clear Clear   Leukocytes,UA Negative Negative   Protein,UA Negative Negative/Trace   Glucose, UA Negative Negative   Ketones, UA Negative Negative   RBC, UA Negative Negative   Bilirubin, UA Negative Negative   Urobilinogen, Ur 0.2 0.2 - 1.0 mg/dL   Nitrite, UA Negative Negative   Microscopic Examination Comment   TSH   Collection Time: 07/30/22  8:26 AM  Result Value Ref Range   TSH 0.962 0.450 - 4.500 uIU/mL  Lipid panel   Collection Time: 07/30/22  8:26 AM  Result Value Ref Range   Cholesterol, Total 179 100 - 199 mg/dL   Triglycerides 75 0 - 149 mg/dL   HDL 79 >16 mg/dL   VLDL Cholesterol Cal 14 5 - 40 mg/dL   LDL Chol Calc (NIH) 86 0 - 99 mg/dL   Chol/HDL Ratio 2.3 0.0 - 5.0 ratio  CBC with Differential/Platelet   Collection Time: 07/30/22  8:26 AM  Result Value Ref Range   WBC 4.3 3.4 - 10.8 x10E3/uL   RBC 5.38 4.14 - 5.80 x10E6/uL   Hemoglobin 15.4 13.0 - 17.7 g/dL   Hematocrit 10.9 60.4 - 51.0 %   MCV 90 79 - 97 fL   MCH 28.6 26.6 - 33.0 pg   MCHC 31.7 31.5 - 35.7 g/dL   RDW 54.0 98.1 - 19.1 %   Platelets 188 150 - 450 x10E3/uL   Neutrophils 44 Not Estab. %   Lymphs 40 Not Estab. %   Monocytes 9 Not Estab. %   Eos 5 Not Estab. %   Basos 1 Not Estab. %   Neutrophils Absolute 1.9 1.4 - 7.0 x10E3/uL   Lymphocytes Absolute 1.7 0.7 - 3.1 x10E3/uL   Monocytes Absolute 0.4 0.1 - 0.9 x10E3/uL   EOS (ABSOLUTE) 0.2 0.0 - 0.4 x10E3/uL   Basophils Absolute 0.0 0.0 - 0.2 x10E3/uL   Immature Granulocytes 1 Not Estab. %   Immature Grans (Abs) 0.0 0.0 -  0.1 x10E3/uL  Comprehensive metabolic panel   Collection Time: 07/30/22  8:26 AM   Result Value Ref Range   Glucose 104 (H) 70 - 99 mg/dL   BUN 12 6 - 20 mg/dL   Creatinine, Ser 1.61 0.76 - 1.27 mg/dL   eGFR 79 >09 UE/AVW/0.98   BUN/Creatinine Ratio 10 9 - 20   Sodium 138 134 - 144 mmol/L   Potassium 4.5 3.5 - 5.2 mmol/L   Chloride 101 96 - 106 mmol/L   CO2 23 20 - 29 mmol/L   Calcium 9.4 8.7 - 10.2 mg/dL   Total Protein 6.9 6.0 - 8.5 g/dL   Albumin 4.3 4.1 - 5.1 g/dL   Globulin, Total 2.6 1.5 - 4.5 g/dL   Albumin/Globulin Ratio 1.7 1.2 - 2.2   Bilirubin Total 0.5 0.0 - 1.2 mg/dL   Alkaline Phosphatase 76 44 - 121 IU/L   AST 26 0 - 40 IU/L   ALT 23 0 - 44 IU/L      Assessment & Plan:   Problem List Items Addressed This Visit   None    Discussed aspirin prophylaxis for myocardial infarction prevention and decision was {Blank single:19197::"it was not indicated","made to continue ASA","made to start ASA","made to stop ASA","that we recommended ASA, and patient refused"}  LABORATORY TESTING:  Health maintenance labs ordered today as discussed above.   The natural history of prostate cancer and ongoing controversy regarding screening and potential treatment outcomes of prostate cancer has been discussed with the patient. The meaning of a false positive PSA and a false negative PSA has been discussed. He indicates understanding of the limitations of this screening test and wishes *** to proceed with screening PSA testing.   IMMUNIZATIONS:   - Tdap: Tetanus vaccination status reviewed: {tetanus status:315746}. - Influenza: {Blank single:19197::"Up to date","Administered today","Postponed to flu season","Refused","Given elsewhere"} - Pneumovax: {Blank single:19197::"Up to date","Administered today","Not applicable","Refused","Given elsewhere"} - Prevnar: {Blank single:19197::"Up to date","Administered today","Not applicable","Refused","Given elsewhere"} - COVID: {Blank single:19197::"Up to date","Administered today","Not applicable","Refused","Given  elsewhere"} - HPV: {Blank single:19197::"Up to date","Administered today","Not applicable","Refused","Given elsewhere"} - Shingrix vaccine: {Blank single:19197::"Up to date","Administered today","Not applicable","Refused","Given elsewhere"}  SCREENING: - Colonoscopy: {Blank single:19197::"Up to date","Ordered today","Not applicable","Refused","Done elsewhere"}  Discussed with patient purpose of the colonoscopy is to detect colon cancer at curable precancerous or early stages   - AAA Screening: {Blank single:19197::"Up to date","Ordered today","Not applicable","Refused","Done elsewhere"}  -Hearing Test: {Blank single:19197::"Up to date","Ordered today","Not applicable","Refused","Done elsewhere"}  -Spirometry: {Blank single:19197::"Up to date","Ordered today","Not applicable","Refused","Done elsewhere"}   PATIENT COUNSELING:    Sexuality: Discussed sexually transmitted diseases, partner selection, use of condoms, avoidance of unintended pregnancy  and contraceptive alternatives.   Advised to avoid cigarette smoking.  I discussed with the patient that most people either abstain from alcohol or drink within safe limits (<=14/week and <=4 drinks/occasion for males, <=7/weeks and <= 3 drinks/occasion for females) and that the risk for alcohol disorders and other health effects rises proportionally with the number of drinks per week and how often a drinker exceeds daily limits.  Discussed cessation/primary prevention of drug use and availability of treatment for abuse.   Diet: Encouraged to adjust caloric intake to maintain  or achieve ideal body weight, to reduce intake of dietary saturated fat and total fat, to limit sodium intake by avoiding high sodium foods and not adding table salt, and to maintain adequate dietary potassium and calcium preferably from fresh fruits, vegetables, and low-fat dairy products.    stressed the importance of regular exercise  Injury prevention: Discussed safety  belts, safety helmets, smoke detector, smoking near bedding or upholstery.   Dental health: Discussed importance of regular tooth brushing, flossing, and dental visits.   Follow up plan: NEXT PREVENTATIVE PHYSICAL DUE IN 1 YEAR. No follow-ups on file.

## 2024-02-25 ENCOUNTER — Encounter: Admitting: Nurse Practitioner

## 2024-02-26 ENCOUNTER — Encounter: Admitting: Nurse Practitioner

## 2024-02-26 ENCOUNTER — Encounter: Payer: Self-pay | Admitting: Nurse Practitioner

## 2024-02-26 VITALS — BP 124/72 | HR 73 | Temp 97.9°F | Ht 70.47 in | Wt 232.0 lb

## 2024-02-26 DIAGNOSIS — Z136 Encounter for screening for cardiovascular disorders: Secondary | ICD-10-CM

## 2024-02-26 DIAGNOSIS — Z0000 Encounter for general adult medical examination without abnormal findings: Secondary | ICD-10-CM

## 2024-02-26 DIAGNOSIS — R7309 Other abnormal glucose: Secondary | ICD-10-CM | POA: Diagnosis not present

## 2024-02-26 NOTE — Progress Notes (Signed)
 "  BP 124/72 (BP Location: Right Arm, Patient Position: Sitting, Cuff Size: Large)   Pulse 73   Temp 97.9 F (36.6 C) (Oral)   Ht 5' 10.47 (1.79 m)   Wt 232 lb (105.2 kg)   SpO2 98%   BMI 32.84 kg/m    Subjective:    Patient ID: Greg Bradley, male    DOB: [birth year], 38 y.o.   MRN: [id number]  HPI: Greg Bradley is a 38 y.o. male presenting on 02/26/2024 for comprehensive medical examination. Current medical complaints include:none  He currently lives with: Interim Problems from his last visit: no   Denies HA, CP, SOB, dizziness, palpitations, visual changes, and lower extremity swelling.   Depression Screen done today and results listed below:     02/26/2024    8:09 AM 07/30/2022    8:14 AM 07/12/2021    8:21 AM 03/01/2020    9:46 AM 06/24/2019    9:06 AM  Depression screen PHQ 2/9  Decreased Interest 0 0 0 0 0  Down, Depressed, Hopeless 0 0 0 0 0  PHQ - 2 Score 0 0 0 0 0  Altered sleeping 0 0 0  0  Tired, decreased energy 0 0 0  0  Change in appetite 0 0 0  0  Feeling bad or failure about yourself  0 0 0  0  Trouble concentrating 0 0 0  0  Moving slowly or fidgety/restless 0 0 0  0  Suicidal thoughts 0 0 0  0  PHQ-9 Score 0 0  0   0   Difficult doing work/chores Not difficult at all Not difficult at all Not difficult at all       Data saved with a previous flowsheet row definition    The patient does not have a history of falls. I did complete a risk assessment for falls. A plan of care for falls was documented.   Past Medical History:  Past Medical History:  Diagnosis Date   Allergy    Clavicular fracture    left, football injury   COVID 10/2019    Surgical History:  Past Surgical History:  Procedure Laterality Date   NO PAST SURGERIES      Medications:  Current Outpatient Medications on File Prior to Visit  Medication Sig   loratadine (CLARITIN) 10 MG tablet Take 10 mg by mouth daily.   methylPREDNISolone  (MEDROL  DOSEPAK) 4 MG TBPK tablet Take as  directed (Patient not taking: Reported on 02/26/2024)   No current facility-administered medications on file prior to visit.    Allergies:  Allergies[1]  Social History:  Social History   Socioeconomic History   Marital status: Married    Spouse name: Not on file   Number of children: Not on file   Years of education: Not on file   Highest education level: Not on file  Occupational History   Not on file  Tobacco Use   Smoking status: Never   Smokeless tobacco: Never  Vaping Use   Vaping status: Never Used  Substance and Sexual Activity   Alcohol use: No   Drug use: No   Sexual activity: Yes  Other Topics Concern   Not on file  Social History Narrative   Lives with wife and 2 children in [place]. 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[employer]      Diet - regular      Exercise -  3 days per week, [employer]   Social Drivers of Health   Tobacco Use: Low Risk (02/26/2024)   Patient History    Smoking Tobacco Use: Never    Smokeless Tobacco Use: Never    Passive Exposure: Not on file  Financial Resource Strain: Not on file  Food Insecurity: Not on file  Transportation Needs: Not on file  Physical Activity: Not on file  Stress: Not on file  Social Connections: Unknown (12/28/2022)   Received from [facility]   Social Network    Social Network: Not on file  Intimate Partner Violence: Unknown (12/28/2022)   Received from [facility]   HITS    Physically Hurt: Not on file    Insult or Talk Down To: Not on file    Threaten Physical Harm: Not on file    Scream or Curse: Not on file  Depression (PHQ2-9): Low Risk (02/26/2024)   Depression (PHQ2-9)    PHQ-2 Score: 0  Alcohol Screen: Not on file  Housing: Not on file  Utilities: Not on file  Health Literacy: Not on file   Tobacco Use History[2] Social History   Substance and Sexual Activity  Alcohol Use No    Family History:  Family History  Problem Relation Age of  Onset   Hypertension Paternal Grandmother    Hypertension Father    Hypertension Brother    Cancer Maternal Uncle 78       colon   Heart disease Paternal Grandfather    Thyroid disease Mother     Past medical history, surgical history, medications, allergies, family history and social history reviewed with patient today and changes made to appropriate areas of the chart.   Review of Systems  Eyes:  Negative for blurred vision and double vision.  Respiratory:  Negative for shortness of breath.   Cardiovascular:  Negative for chest pain, palpitations and leg swelling.  Neurological:  Negative for dizziness and headaches.   All other ROS negative except what is listed above and in the HPI.      Objective:    BP 124/72 (BP Location: Right Arm, Patient Position: Sitting, Cuff Size: Large)   Pulse 73   Temp 97.9 F (36.6 C) (Oral)   Ht 5' 10.47 (1.79 m)   Wt 232 lb (105.2 kg)   SpO2 98%   BMI 32.84 kg/m   Wt Readings from Last 3 Encounters:  02/26/24 232 lb (105.2 kg)  07/30/22 227 lb (103 kg)  07/12/21 219 lb 6.4 oz (99.5 kg)    Physical Exam Vitals and nursing note reviewed.  Constitutional:      General: He is not in acute distress.    Appearance: Normal appearance. He is not ill-appearing, toxic-appearing or diaphoretic.  HENT:     Head: Normocephalic.     Right Ear: Tympanic membrane, ear canal and external ear normal.     Left Ear: Tympanic membrane, ear canal and external ear normal.     Nose: Nose normal. No congestion or rhinorrhea.     Mouth/Throat:     Mouth: Mucous membranes are moist.  Eyes:     General:        Right eye: No discharge.        Left eye: No discharge.     Extraocular Movements: Extraocular movements intact.     Conjunctiva/sclera: Conjunctivae normal.     Pupils: Pupils are equal, round, and reactive to light.  Cardiovascular:     Rate and Rhythm: Normal rate and  regular rhythm.     Heart sounds: No murmur heard. Pulmonary:      Effort: Pulmonary effort is normal. No respiratory distress.     Breath sounds: Normal breath sounds. No wheezing, rhonchi or rales.  Abdominal:     General: Abdomen is flat. Bowel sounds are normal. There is no distension.     Palpations: Abdomen is soft.     Tenderness: There is no abdominal tenderness. There is no guarding.  Musculoskeletal:     Cervical back: Normal range of motion and neck supple.  Skin:    General: Skin is warm and dry.     Capillary Refill: Capillary refill takes less than 2 seconds.  Neurological:     General: No focal deficit present.     Mental Status: He is alert and oriented to person, place, and time.     Cranial Nerves: No cranial nerve deficit.     Motor: No weakness.     Deep Tendon Reflexes: Reflexes normal.  Psychiatric:        Mood and Affect: Mood normal.        Behavior: Behavior normal.        Thought Content: Thought content normal.        Judgment: Judgment normal.     Results for orders placed or performed in visit on 07/30/22  Urinalysis, Routine w reflex microscopic   Collection Time: 07/30/22  8:24 AM  Result Value Ref Range   Specific Gravity, UA 1.015 1.005 - 1.030   pH, UA 7.0 5.0 - 7.5   Color, UA Yellow Yellow   Appearance Ur Clear Clear   Leukocytes,UA Negative Negative   Protein,UA Negative Negative/Trace   Glucose, UA Negative Negative   Ketones, UA Negative Negative   RBC, UA Negative Negative   Bilirubin, UA Negative Negative   Urobilinogen, Ur 0.2 0.2 - 1.0 mg/dL   Nitrite, UA Negative Negative   Microscopic Examination Comment   TSH   Collection Time: 07/30/22  8:26 AM  Result Value Ref Range   TSH 0.962 0.450 - 4.500 uIU/mL  Lipid panel   Collection Time: 07/30/22  8:26 AM  Result Value Ref Range   Cholesterol, Total 179 100 - 199 mg/dL   Triglycerides 75 0 - 149 mg/dL   HDL 79 >60 mg/dL   VLDL Cholesterol Cal 14 5 - 40 mg/dL   LDL Chol Calc (NIH) 86 0 - 99 mg/dL   Chol/HDL Ratio 2.3 0.0 - 5.0 ratio  CBC  with Differential/Platelet   Collection Time: 07/30/22  8:26 AM  Result Value Ref Range   WBC 4.3 3.4 - 10.8 x10E3/uL   RBC 5.38 4.14 - 5.80 x10E6/uL   Hemoglobin 15.4 13.0 - 17.7 g/dL   Hematocrit 51.3 62.4 - 51.0 %   MCV 90 79 - 97 fL   MCH 28.6 26.6 - 33.0 pg   MCHC 31.7 31.5 - 35.7 g/dL   RDW 86.8 88.3 - 84.5 %   Platelets 188 150 - 450 x10E3/uL   Neutrophils 44 Not Estab. %   Lymphs 40 Not Estab. %   Monocytes 9 Not Estab. %   Eos 5 Not Estab. %   Basos 1 Not Estab. %   Neutrophils Absolute 1.9 1.4 - 7.0 x10E3/uL   Lymphocytes Absolute 1.7 0.7 - 3.1 x10E3/uL   Monocytes Absolute 0.4 0.1 - 0.9 x10E3/uL   EOS (ABSOLUTE) 0.2 0.0 - 0.4 x10E3/uL   Basophils Absolute 0.0 0.0 - 0.2 x10E3/uL   Immature Granulocytes  1 Not Estab. %   Immature Grans (Abs) 0.0 0.0 - 0.1 x10E3/uL  Comprehensive metabolic panel   Collection Time: 07/30/22  8:26 AM  Result Value Ref Range   Glucose 104 (H) 70 - 99 mg/dL   BUN 12 6 - 20 mg/dL   Creatinine, Ser 8.78 0.76 - 1.27 mg/dL   eGFR 79 >40 fO/fpw/8.26   BUN/Creatinine Ratio 10 9 - 20   Sodium 138 134 - 144 mmol/L   Potassium 4.5 3.5 - 5.2 mmol/L   Chloride 101 96 - 106 mmol/L   CO2 23 20 - 29 mmol/L   Calcium 9.4 8.7 - 10.2 mg/dL   Total Protein 6.9 6.0 - 8.5 g/dL   Albumin 4.3 4.1 - 5.1 g/dL   Globulin, Total 2.6 1.5 - 4.5 g/dL   Albumin/Globulin Ratio 1.7 1.2 - 2.2   Bilirubin Total 0.5 0.0 - 1.2 mg/dL   Alkaline Phosphatase 76 44 - 121 IU/L   AST 26 0 - 40 IU/L   ALT 23 0 - 44 IU/L      Assessment & Plan:   Problem List Items Addressed This Visit   None Visit Diagnoses       Annual physical exam    -  Primary   Health maintenance reviewed during visit today.  Labs ordered. Vaccines reviewed.   Relevant Orders   TSH   Lipid panel   CBC with Differential/Platelet   Comprehensive metabolic panel with GFR     Screening for ischemic heart disease       Relevant Orders   Lipid panel     Elevated glucose       Relevant Orders    HgB A1c        Discussed aspirin prophylaxis for myocardial infarction prevention and decision was it was not indicated  LABORATORY TESTING:  Health maintenance labs ordered today as discussed above.     IMMUNIZATIONS:   - Tdap: Tetanus vaccination status reviewed: last tetanus booster within 10 years. - Influenza: Refused - Pneumovax: Not applicable - Prevnar: Not applicable - COVID: Refused - HPV: Not applicable - Shingrix vaccine: Not applicable  SCREENING: - Colonoscopy: Not applicable  Discussed with patient purpose of the colonoscopy is to detect colon cancer at curable precancerous or early stages   - AAA Screening: Not applicable  -Hearing Test: Not applicable  -Spirometry: Not applicable   PATIENT COUNSELING:    Sexuality: Discussed sexually transmitted diseases, partner selection, use of condoms, avoidance of unintended pregnancy  and contraceptive alternatives.   Advised to avoid cigarette smoking.  I discussed with the patient that most people either abstain from alcohol or drink within safe limits (<=14/week and <=4 drinks/occasion for males, <=7/weeks and <= 3 drinks/occasion for females) and that the risk for alcohol disorders and other health effects rises proportionally with the number of drinks per week and how often a drinker exceeds daily limits.  Discussed cessation/primary prevention of drug use and availability of treatment for abuse.   Diet: Encouraged to adjust caloric intake to maintain  or achieve ideal body weight, to reduce intake of dietary saturated fat and total fat, to limit sodium intake by avoiding high sodium foods and not adding table salt, and to maintain adequate dietary potassium and calcium preferably from fresh fruits, vegetables, and low-fat dairy products.    stressed the importance of regular exercise  Injury prevention: Discussed safety belts, safety helmets, smoke detector, smoking near bedding or upholstery.   Dental  health: Discussed importance of  regular tooth brushing, flossing, and dental visits.   Follow up plan: NEXT PREVENTATIVE PHYSICAL DUE IN 1 YEAR. Return in about 1 year (around 02/25/2025) for Physical and Fasting labs.     [1] No Known Allergies [2]  Social History Tobacco Use  Smoking Status Never  Smokeless Tobacco Never   "

## 2024-02-27 LAB — COMPREHENSIVE METABOLIC PANEL WITH GFR
ALT: 26 IU/L (ref 0–44)
AST: 26 IU/L (ref 0–40)
Albumin: 4.6 g/dL (ref 4.1–5.1)
Alkaline Phosphatase: 71 IU/L (ref 47–123)
BUN/Creatinine Ratio: 12 (ref 9–20)
BUN: 16 mg/dL (ref 6–20)
Bilirubin Total: 0.6 mg/dL (ref 0.0–1.2)
CO2: 26 mmol/L (ref 20–29)
Calcium: 10.1 mg/dL (ref 8.7–10.2)
Chloride: 99 mmol/L (ref 96–106)
Creatinine, Ser: 1.29 mg/dL — ABNORMAL HIGH (ref 0.76–1.27)
Globulin, Total: 2.8 g/dL (ref 1.5–4.5)
Glucose: 98 mg/dL (ref 70–99)
Potassium: 4.4 mmol/L (ref 3.5–5.2)
Sodium: 139 mmol/L (ref 134–144)
Total Protein: 7.4 g/dL (ref 6.0–8.5)
eGFR: 72 mL/min/1.73

## 2024-02-27 LAB — LIPID PANEL
Chol/HDL Ratio: 2.5 ratio (ref 0.0–5.0)
Cholesterol, Total: 174 mg/dL (ref 100–199)
HDL: 69 mg/dL
LDL Chol Calc (NIH): 93 mg/dL (ref 0–99)
Triglycerides: 64 mg/dL (ref 0–149)
VLDL Cholesterol Cal: 12 mg/dL (ref 5–40)

## 2024-02-27 LAB — CBC WITH DIFFERENTIAL/PLATELET
Basophils Absolute: 0.1 x10E3/uL (ref 0.0–0.2)
Basos: 1 %
EOS (ABSOLUTE): 0.3 x10E3/uL (ref 0.0–0.4)
Eos: 6 %
Hematocrit: 49.4 % (ref 37.5–51.0)
Hemoglobin: 16.4 g/dL (ref 13.0–17.7)
Immature Grans (Abs): 0 x10E3/uL (ref 0.0–0.1)
Immature Granulocytes: 0 %
Lymphocytes Absolute: 1.8 x10E3/uL (ref 0.7–3.1)
Lymphs: 42 %
MCH: 29.6 pg (ref 26.6–33.0)
MCHC: 33.2 g/dL (ref 31.5–35.7)
MCV: 89 fL (ref 79–97)
Monocytes Absolute: 0.5 x10E3/uL (ref 0.1–0.9)
Monocytes: 10 %
Neutrophils Absolute: 1.8 x10E3/uL (ref 1.4–7.0)
Neutrophils: 41 %
Platelets: 216 x10E3/uL (ref 150–450)
RBC: 5.54 x10E6/uL (ref 4.14–5.80)
RDW: 12.7 % (ref 11.6–15.4)
WBC: 4.3 x10E3/uL (ref 3.4–10.8)

## 2024-02-27 LAB — TSH: TSH: 1.25 u[IU]/mL (ref 0.450–4.500)

## 2024-02-27 LAB — HEMOGLOBIN A1C
Est. average glucose Bld gHb Est-mCnc: 126 mg/dL
Hgb A1c MFr Bld: 6 % — ABNORMAL HIGH (ref 4.8–5.6)

## 2024-02-28 ENCOUNTER — Ambulatory Visit: Payer: Self-pay | Admitting: Nurse Practitioner

## 2025-03-05 ENCOUNTER — Encounter: Admitting: Nurse Practitioner
